# Patient Record
Sex: Female | Born: 1970 | Race: White | Hispanic: No | Marital: Married | Smoking: Never smoker
Health system: Southern US, Community
[De-identification: ages and names within clinical notes are randomized; demographics above are authoritative.]

## PROBLEM LIST (undated history)

## (undated) DIAGNOSIS — R112 Nausea with vomiting, unspecified: Secondary | ICD-10-CM

## (undated) DIAGNOSIS — Z9889 Other specified postprocedural states: Secondary | ICD-10-CM

## (undated) DIAGNOSIS — Z87442 Personal history of urinary calculi: Secondary | ICD-10-CM

## (undated) DIAGNOSIS — A4150 Gram-negative sepsis, unspecified: Secondary | ICD-10-CM

## (undated) DIAGNOSIS — K219 Gastro-esophageal reflux disease without esophagitis: Secondary | ICD-10-CM

---

## 1995-11-06 HISTORY — PX: APPENDECTOMY: SHX54

## 2006-07-12 ENCOUNTER — Emergency Department: Payer: Self-pay | Admitting: Emergency Medicine

## 2008-11-05 HISTORY — PX: ABDOMINAL SURGERY: SHX537

## 2012-09-25 ENCOUNTER — Ambulatory Visit: Payer: Self-pay | Admitting: Family Medicine

## 2013-04-11 ENCOUNTER — Ambulatory Visit: Payer: Self-pay | Admitting: Family Medicine

## 2013-04-11 LAB — COMPREHENSIVE METABOLIC PANEL
Anion Gap: 12 (ref 7–16)
BUN: 13 mg/dL (ref 7–18)
Chloride: 103 mmol/L (ref 98–107)
EGFR (African American): >60
Glucose: 103 mg/dL — ABNORMAL HIGH (ref 65–99)
Osmolality: 276 (ref 275–301)
SGOT(AST): 18 U/L (ref 15–37)
SGPT (ALT): 30 U/L (ref 12–78)
Total Protein: 7.3 g/dL (ref 6.4–8.2)

## 2013-04-11 LAB — CBC WITH DIFFERENTIAL/PLATELET
Eosinophil %: 1.4 %
HCT: 42.6 % (ref 35.0–47.0)
Lymphocyte #: 0.7 10*3/uL — ABNORMAL LOW (ref 1.0–3.6)
Lymphocyte %: 9.9 %
MCH: 29.2 pg (ref 26.0–34.0)
Monocyte %: 4.6 %
Platelet: 299 10*3/uL (ref 150–440)
RBC: 4.91 10*6/uL (ref 3.80–5.20)
WBC: 6.7 10*3/uL (ref 3.6–11.0)

## 2013-04-11 LAB — URINALYSIS, COMPLETE
Bilirubin,UR: NEGATIVE
Leukocyte Esterase: NEGATIVE
Nitrite: NEGATIVE
Ph: 6 (ref 4.5–8.0)
WBC UR: NONE SEEN /HPF (ref 0–5)

## 2013-04-11 LAB — LIPASE, BLOOD: Lipase: 66 U/L — ABNORMAL LOW (ref 73–393)

## 2014-07-08 ENCOUNTER — Ambulatory Visit: Payer: Self-pay | Admitting: Emergency Medicine

## 2015-02-23 ENCOUNTER — Ambulatory Visit
Admit: 2015-02-23 | Disposition: A | Discharge status: Home / Self Care | Payer: Self-pay | Attending: Family Medicine | Admitting: Family Medicine

## 2015-02-23 LAB — RAPID STREP-A WITH REFLX: Micro Text Report: NEGATIVE

## 2015-02-25 LAB — BETA STREP CULTURE(ARMC)

## 2016-01-11 ENCOUNTER — Encounter: Payer: Self-pay | Admitting: Emergency Medicine

## 2016-01-11 ENCOUNTER — Ambulatory Visit
Admission: EM | Admit: 2016-01-11 | Discharge: 2016-01-11 | Disposition: A | Discharge status: Home / Self Care | Payer: BLUE CROSS/BLUE SHIELD | Attending: Family Medicine | Admitting: Family Medicine

## 2016-01-11 DIAGNOSIS — J0100 Acute maxillary sinusitis, unspecified: Secondary | ICD-10-CM | POA: Diagnosis not present

## 2016-01-11 DIAGNOSIS — J40 Bronchitis, not specified as acute or chronic: Secondary | ICD-10-CM

## 2016-01-11 MED ORDER — GUAIFENESIN-CODEINE 100-10 MG/5ML PO SOLN: 10.0000 mL | Freq: Every evening | ORAL | Status: DC | PRN

## 2016-01-11 MED ORDER — AMOXICILLIN-POT CLAVULANATE 875-125 MG PO TABS: 1.0000 | ORAL_TABLET | Freq: Two times a day (BID) | ORAL | Status: DC

## 2016-01-11 NOTE — ED Provider Notes (Signed)
[facility]  ____________________________________________  Time seen: Approximately 2:42 PM  I have reviewed the triage vital signs and the nursing notes.   HISTORY  Chief Complaint Cough   HPI Deborah Mclean is a 45 y.o. female resents with complaints of 1 week of nasal congestion, runny nose, sinus pressure and postnasal drainage. Patient reports in the last several days she has had more of a cough. Patient states that the cough is worse at night. States the cough is primarily a dry cough. Reports frequently blowing her nose getting greenish yellowish drainage out. Patient reports initial symptom onset last Wednesday and Thursday she did have a fever this 2 days but states she has not had a fever since. Reports continues to eat and drink well. Reports has been taking over-the-counter cough and congestion medications which have not really helped.   Denies chest pain, shortness of breath, weakness, dizziness, abdominal pain, dysuria, rash.   LMP: 3 weeks ago, denies chance of pregnancy.   History reviewed. No pertinent past medical history.  There are no active problems to display for this patient.   Past Surgical History  Procedure Laterality Date  . Appendectomy    . Cesarean section    . Abdominal surgery      Current Outpatient Rx  Name  Route  Sig  Dispense  Refill  .           Marland Kitchen             Allergies Review of patient's allergies indicates no known allergies.  History reviewed. No pertinent family history.  Social History Social History  Substance Use Topics  . Smoking status: Never Smoker   . Smokeless tobacco: None  . Alcohol Use: No    Review of Systems Constitutional: as above.  Eyes: No visual changes. ENT: No sore throat. Positive runny nose, nasal congestion and sinus pressure.  Cardiovascular: Denies chest pain. Respiratory: Denies shortness of breath. Gastrointestinal: No abdominal pain.  No nausea, no vomiting.  No diarrhea.  No  constipation. Genitourinary: Negative for dysuria. Musculoskeletal: Negative for back pain. Skin: Negative for rash. Neurological: Negative for headaches, focal weakness or numbness.  10-point ROS otherwise negative.  ____________________________________________   PHYSICAL EXAM:  VITAL SIGNS: ED Triage Vitals  Enc Vitals Group     BP 01/11/16 1400 127/78 mmHg     Pulse Rate 01/11/16 1400 86     Resp 01/11/16 1400 16     Temp 01/11/16 1400 97 F (36.1 C)     Temp Source 01/11/16 1400 Tympanic     SpO2 01/11/16 1400 98 %     Weight 01/11/16 1400 165 lb (74.844 kg)     Height 01/11/16 1400 5\' 6"  (1.676 m)     Head Cir --      Peak Flow --      Pain Score 01/11/16 1403 0     Pain Loc --      Pain Edu? --      Excl. in [place]? --   Constitutional: Alert and oriented. Well appearing and in no acute distress. Eyes: Conjunctivae are normal. PERRL. EOMI. Head: Atraumatic.Mild tenderness to palpation bilateral frontal and maxillary sinuses. No swelling. No erythema.   Ears: no erythema, normal TMs bilaterally.   Nose: nasal congestion with bilateral nasal turbinate erythema and edema. Greenish drainage present.   Mouth/Throat: Mucous membranes are moist.  Oropharynx non-erythematous.No tonsillar swelling or exudate.  Neck: No stridor.  No cervical spine tenderness to palpation. Hematological/Lymphatic/Immunilogical:  No cervical lymphadenopathy. Cardiovascular: Normal rate, regular rhythm. Grossly normal heart sounds.  Good peripheral circulation. Respiratory: Normal respiratory effort.  No retractions. Lungs CTAB. No wheezes, rales or rhonchi. Good air movement. Dry intermittent cough noted in room.  Gastrointestinal: Soft and nontender. No distention. Normal Bowel sounds. No CVA tenderness. Musculoskeletal: No lower or upper extremity tenderness nor edema.  Bilateral pedal pulses equal and easily palpated. No cervical, thoracic or lumbar tenderness to palpation.  Neurologic:  Normal  speech and language. No gross focal neurologic deficits are appreciated. No gait instability. Skin:  Skin is warm, dry and intact. No rash noted. Psychiatric: Mood and affect are normal. Speech and behavior are normal.  ____________________________________________   LABS (all labs ordered are listed, but only abnormal results are displayed)  Labs Reviewed - No data to display   INITIAL IMPRESSION / ASSESSMENT AND PLAN / ED COURSE  Pertinent labs & imaging results that were available during my care of the patient were reviewed by me and considered in my medical decision making (see chart for details).  Well appearing. No acute distress. Presents for complaints of one week of runny nose, nasal congestion, cough. Will treat sinusitis and bronchitis with oral augmentin, prn otc claritin, and prn guaifenesin with codeine. Encourage rest, fluids, otc tylenol or ibuprofen as needed.   Discussed follow up with Primary care physician this week. Discussed follow up and return parameters including no resolution or any worsening concerns. Patient verbalized understanding and agreed to plan.   ____________________________________________   FINAL CLINICAL IMPRESSION(S) / ED DIAGNOSES  Final diagnoses:  Acute maxillary sinusitis, recurrence not specified  Bronchitis      Note: This dictation was prepared with Dragon dictation along with smaller phrase technology. Any transcriptional errors that result from this process are unintentional.    Marylene Land, NP 01/11/16 1455

## 2016-01-11 NOTE — ED Notes (Signed)
Patient c/o cough and chest congestion for 2 days.  Patient reports cold symptoms started a week ago.

## 2016-01-11 NOTE — Discharge Instructions (Signed)
Take medication as prescribed. Rest. Drink plenty of fluids.   Follow up with your primary care physician this week as needed. Return to Urgent care for new or worsening concerns.    Sinusitis, Adult Sinusitis is redness, soreness, and inflammation of the paranasal sinuses. Paranasal sinuses are air pockets within the bones of your face. They are located beneath your eyes, in the middle of your forehead, and above your eyes. In healthy paranasal sinuses, mucus is able to drain out, and air is able to circulate through them by way of your nose. However, when your paranasal sinuses are inflamed, mucus and air can become trapped. This can allow bacteria and other germs to grow and cause infection. Sinusitis can develop quickly and last only a short time (acute) or continue over a long period (chronic). Sinusitis that lasts for more than 12 weeks is considered chronic. CAUSES Causes of sinusitis include:  Allergies.  Structural abnormalities, such as displacement of the cartilage that separates your nostrils (deviated septum), which can decrease the air flow through your nose and sinuses and affect sinus drainage.  Functional abnormalities, such as when the small hairs (cilia) that line your sinuses and help remove mucus do not work properly or are not present. SIGNS AND SYMPTOMS Symptoms of acute and chronic sinusitis are the same. The primary symptoms are pain and pressure around the affected sinuses. Other symptoms include:  Upper toothache.  Earache.  Headache.  Bad breath.  Decreased sense of smell and taste.  A cough, which worsens when you are lying flat.  Fatigue.  Fever.  Thick drainage from your nose, which often is green and may contain pus (purulent).  Swelling and warmth over the affected sinuses. DIAGNOSIS Your health care provider will perform a physical exam. During your exam, your health care provider may perform any of the following to help determine if you have  acute sinusitis or chronic sinusitis:  Look in your nose for signs of abnormal growths in your nostrils (nasal polyps).  Tap over the affected sinus to check for signs of infection.  View the inside of your sinuses using an imaging device that has a light attached (endoscope). If your health care provider suspects that you have chronic sinusitis, one or more of the following tests may be recommended:  Allergy tests.  Nasal culture. A sample of mucus is taken from your nose, sent to a lab, and screened for bacteria.  Nasal cytology. A sample of mucus is taken from your nose and examined by your health care provider to determine if your sinusitis is related to an allergy. TREATMENT Most cases of acute sinusitis are related to a viral infection and will resolve on their own within 10 days. Sometimes, medicines are prescribed to help relieve symptoms of both acute and chronic sinusitis. These may include pain medicines, decongestants, nasal steroid sprays, or saline sprays. However, for sinusitis related to a bacterial infection, your health care provider will prescribe antibiotic medicines. These are medicines that will help kill the bacteria causing the infection. Rarely, sinusitis is caused by a fungal infection. In these cases, your health care provider will prescribe antifungal medicine. For some cases of chronic sinusitis, surgery is needed. Generally, these are cases in which sinusitis recurs more than 3 times per year, despite other treatments. HOME CARE INSTRUCTIONS  Drink plenty of water. Water helps thin the mucus so your sinuses can drain more easily.  Use a humidifier.  Inhale steam 3-4 times a day (for example, sit in  the bathroom with the shower running).  Apply a warm, moist washcloth to your face 3-4 times a day, or as directed by your health care provider.  Use saline nasal sprays to help moisten and clean your sinuses.  Take medicines only as directed by your health care  provider.  If you were prescribed either an antibiotic or antifungal medicine, finish it all even if you start to feel better. SEEK IMMEDIATE MEDICAL CARE IF:  You have increasing pain or severe headaches.  You have nausea, vomiting, or drowsiness.  You have swelling around your face.  You have vision problems.  You have a stiff neck.  You have difficulty breathing.   This information is not intended to replace advice given to you by your health care provider. Make sure you discuss any questions you have with your health care provider.   Document Released: 10/22/2005 Document Revised: 11/12/2014 Document Reviewed: 11/06/2011 Elsevier Interactive Patient Education [employer].

## 2016-07-20 ENCOUNTER — Ambulatory Visit
Admission: EM | Admit: 2016-07-20 | Discharge: 2016-07-20 | Disposition: A | Discharge status: Home / Self Care | Payer: BLUE CROSS/BLUE SHIELD | Attending: Emergency Medicine | Admitting: Emergency Medicine

## 2016-07-20 ENCOUNTER — Encounter: Payer: Self-pay | Admitting: Emergency Medicine

## 2016-07-20 DIAGNOSIS — R05 Cough: Secondary | ICD-10-CM | POA: Diagnosis not present

## 2016-07-20 DIAGNOSIS — J0110 Acute frontal sinusitis, unspecified: Secondary | ICD-10-CM | POA: Diagnosis not present

## 2016-07-20 DIAGNOSIS — R059 Cough, unspecified: Secondary | ICD-10-CM

## 2016-07-20 MED ORDER — FLUTICASONE PROPIONATE 50 MCG/ACT NA SUSP: 2.0000 | Freq: Every day | NASAL | 0 refills | Status: DC

## 2016-07-20 MED ORDER — HYDROCOD POLST-CPM POLST ER 10-8 MG/5ML PO SUER: 5.0000 mL | Freq: Two times a day (BID) | ORAL | 0 refills | Status: DC

## 2016-07-20 MED ORDER — AMOXICILLIN-POT CLAVULANATE 875-125 MG PO TABS: 1.0000 | ORAL_TABLET | Freq: Two times a day (BID) | ORAL | 0 refills | Status: DC

## 2016-07-20 NOTE — ED Provider Notes (Signed)
CSN: [id number]     Arrival date & time 07/20/16  0802 History   First MD Initiated Contact with Patient 07/20/16 [phone]     Chief Complaint  Patient presents with  . Facial Pain   (Consider location/radiation/quality/duration/timing/severity/associated sxs/prior Treatment) HPI  45 year old female who recently returned from [place] after 2 month vacation and has had a week of coughing and 2 days of severe left frontal sinus pain and tooth pain. She states that she is coughing at nighttime a great deal and also during the daytime as well. She tried over-the-counter preparations which have not been helpful Delsym cough syrup has caused her to hallucinate. She's had a history of sinus infections the last one noted was in March 2017 treated here. She has had no fever or chills. All of her pressure seems to be left-sided mostly in the frontal and over the malar region. She states that she is having clear discharge from her nose.   History reviewed. No pertinent past medical history. Past Surgical History:  Procedure Laterality Date  . ABDOMINAL SURGERY    . APPENDECTOMY    . CESAREAN SECTION     Family History  Problem Relation Age of Onset  . Hyperlipidemia Mother   . Hypertension Father   . Hyperlipidemia Father    Social History  Substance Use Topics  . Smoking status: Never Smoker  . Smokeless tobacco: Never Used  . Alcohol use No   OB History    No data available     Review of Systems  Constitutional: Positive for activity change. Negative for appetite change, chills, fatigue and fever.  HENT: Positive for congestion, postnasal drip, rhinorrhea, sinus pressure and sore throat.   Respiratory: Positive for cough. Negative for shortness of breath, wheezing and stridor.   All other systems reviewed and are negative.   Allergies  Review of patient's allergies indicates no known allergies.  Home Medications   Prior to Admission medications   Medication Sig Start Date End Date  Taking? Authorizing Provider  amoxicillin-clavulanate (AUGMENTIN) 875-125 MG tablet Take 1 tablet by mouth every 12 (twelve) hours. 07/20/16   Lorin Picket, PA-C  chlorpheniramine-HYDROcodone (TUSSIONEX PENNKINETIC ER) 10-8 MG/5ML SUER Take 5 mLs by mouth 2 (two) times daily. 07/20/16   Lorin Picket, PA-C  fluticasone (FLONASE) 50 MCG/ACT nasal spray Place 2 sprays into both nostrils daily. 07/20/16   Lorin Picket, PA-C   Meds Ordered and Administered this Visit  Medications - No data to display  BP 107/74 (BP Location: Left Arm)   Pulse 72   Temp 97 F (36.1 C) (Tympanic)   Resp 16   Ht 5\' 6"  (1.676 m)   Wt 160 lb (72.6 kg)   LMP 07/15/2016 (Exact Date)   SpO2 100%   BMI 25.82 kg/m  No data found.   Physical Exam  Constitutional: She is oriented to person, place, and time. She appears well-developed and well-nourished. No distress.  HENT:  Head: Normocephalic and atraumatic.  Right Ear: External ear normal.  Left Ear: External ear normal.  Nose: Nose normal.  Mouth/Throat: Oropharynx is clear and moist. No oropharyngeal exudate.  Patient has left-sided tenderness to percussion over the frontal greater than maxillary sinuses. This is not reproduced on the right side.  Eyes: EOM are normal. Pupils are equal, round, and reactive to light. Right eye exhibits no discharge. Left eye exhibits no discharge.  Neck: Normal range of motion. Neck supple.  Pulmonary/Chest: Effort normal and breath sounds normal. No  respiratory distress. She has no wheezes. She has no rales.  Musculoskeletal: Normal range of motion.  Lymphadenopathy:    She has no cervical adenopathy.  Neurological: She is alert and oriented to person, place, and time.  Skin: Skin is warm and dry. She is not diaphoretic.  Psychiatric: She has a normal mood and affect. Her behavior is normal. Judgment and thought content normal.  Nursing note and vitals reviewed.   Urgent Care Course   Clinical Course     Procedures (including critical care time)  Labs Review Labs Reviewed - No data to display  Imaging Review No results found.   Visual Acuity Review  Right Eye Distance:   Left Eye Distance:   Bilateral Distance:    Right Eye Near:   Left Eye Near:    Bilateral Near:         MDM   1. Acute frontal sinusitis, recurrence not specified   2. Cough    Discharge Medication List as of 07/20/2016  8:32 AM    START taking these medications   Details  amoxicillin-clavulanate (AUGMENTIN) 875-125 MG tablet Take 1 tablet by mouth every 12 (twelve) hours., Starting Fri 07/20/2016, Normal    chlorpheniramine-HYDROcodone (TUSSIONEX PENNKINETIC ER) 10-8 MG/5ML SUER Take 5 mLs by mouth 2 (two) times daily., Starting Fri 07/20/2016, Print    fluticasone (FLONASE) 50 MCG/ACT nasal spray Place 2 sprays into both nostrils daily., Starting Fri 07/20/2016, Normal      Plan: 1. Test/x-ray results and diagnosis reviewed with patient 2. rx as per orders; risks, benefits, potential side effects reviewed with patient 3. Recommend supportive treatment with Cool mist vaporizer at nighttime. She will begin using Flonase daily for the next 3-4 weeks. We'll provide her with Tussionex for her cough at nighttime to allow her to get rest. He did have some hallucinations with the Delsym. Since this is a recurrent problem I will again give her another dose of Augmentin. I have encouraged her to find a primary care physician since her other one  moved. He may return to our clinic in 2 weeks if she continues to cough at which time a x-ray will be obtained of her chest. She should also return if she spikes high fevers or worsens. 4. F/u prn if symptoms worsen or don't improve     Lorin Picket, PA-C 07/20/16 [phone]

## 2016-07-20 NOTE — ED Triage Notes (Signed)
Patient c/o sinus pressure and congestion that started 2 days ago.  Patient c/o cough for over a week.

## 2016-07-25 ENCOUNTER — Ambulatory Visit
Admission: EM | Admit: 2016-07-25 | Discharge: 2016-07-25 | Disposition: A | Discharge status: Home / Self Care | Payer: BLUE CROSS/BLUE SHIELD | Attending: Family Medicine | Admitting: Family Medicine

## 2016-07-25 DIAGNOSIS — J0110 Acute frontal sinusitis, unspecified: Secondary | ICD-10-CM

## 2016-07-25 DIAGNOSIS — T7840XA Allergy, unspecified, initial encounter: Secondary | ICD-10-CM | POA: Diagnosis not present

## 2016-07-25 DIAGNOSIS — J40 Bronchitis, not specified as acute or chronic: Secondary | ICD-10-CM

## 2016-07-25 MED ORDER — DIPHENHYDRAMINE HCL 50 MG PO CAPS
50.0000 mg | ORAL_CAPSULE | Freq: Once | ORAL | Status: AC
  Administered 2016-07-25: 50 mg via ORAL

## 2016-07-25 MED ORDER — PREDNISONE 10 MG PO TABS: ORAL_TABLET | ORAL | 0 refills | Status: DC

## 2016-07-25 MED ORDER — DOXYCYCLINE HYCLATE 100 MG PO CAPS: 100.0000 mg | ORAL_CAPSULE | Freq: Two times a day (BID) | ORAL | 0 refills | Status: DC

## 2016-07-25 NOTE — ED Triage Notes (Signed)
Patient reports that yesterday she had tongue swelling and today has itchiness of her lips. Patient states that she was here on Friday and was given and antibiotic and codeine. Patient states that she is still having a cough that worsened yesterday as well. Patient states that she thinks she may have had an allergic reaction to something. Patient states that yesterday she took benadryl and improved.

## 2016-07-25 NOTE — Discharge Instructions (Signed)
Take medication as prescribed. Rest. Drink plenty of fluids. Take benadryl as discussed.   Follow up with your primary care physician this week as needed. Return to Urgent care for new or worsening concerns.

## 2016-07-25 NOTE — ED Provider Notes (Signed)
[facility] ____________________________________________  Time seen: Approximately 2:32 PM  I have reviewed the triage vital signs and the nursing notes.   HISTORY  Chief Complaint Allergic Reaction and Cough   HPI Deborah Mclean is a 45 y.o. female patient presenting for continued complaints of sinus congestion, sinus drainage and sinus pressure. Patient also reports continued cough. Patient reports that she was seen in urgent care 3 days ago and was started on oral Augmentin and tussionex for frontal sinusitis. Patient reports that yesterday she began having tongue swelling after taking her medications yesterday afternoon. Patient reports that her tongue felt very swollen and puffy, reports that she then took a Benadryl at lunchtime which resolved the swelling. Patient reports she has not yet taken the medications again. Reports no more tongue swelling since. Patient reports that swelling has now resolved and she does not have any other swelling, but reports she does have a slight itching sensation to her lips that is better than yesterday.   Patient reports that she has taken amoxicillin antibiotics and tussionex in the past without any reaction or issues. Patient declines any other changes in foods, medicines, lotions, detergents. Denies any other triggers. Patient reports that overall her sinus congestion pain has improved but continues. Patient reports that her cough is worse at night with associated postnasal drainage. Patient denies fevers. Reports continues to eat and drink well.  Denies any chest pain, shortness of breath, abdominal pain, dysuria, neck pain, back pain. Denies any current lip swelling, tongue swelling, throat swelling, throat discomfort, wheezing, shortness of breath, facial swelling, rash or other complaints. Denies any history of allergic reactions in the past.  Patient's last menstrual period was 07/15/2016 (exact date). Denies chance  present.   History reviewed. No pertinent past medical history.  There are no active problems to display for this patient.   Past Surgical History:  Procedure Laterality Date  . ABDOMINAL SURGERY    . APPENDECTOMY    . CESAREAN SECTION      Current Outpatient Rx  . Order #: [id number] Class: Normal  . Order #: [id number] Class: Print  . Order #: [id number] Class: Normal  . Order #: [id number] Class: Normal  . Order #: [id number] Class: Normal    No current facility-administered medications for this encounter.   Current Outpatient Prescriptions:  .  amoxicillin-clavulanate (AUGMENTIN) 875-125 MG tablet, Take 1 tablet by mouth every 12 (twelve) hours., Disp: 20 tablet, Rfl: 0 .  chlorpheniramine-HYDROcodone (TUSSIONEX PENNKINETIC ER) 10-8 MG/5ML SUER, Take 5 mLs by mouth 2 (two) times daily., Disp: 115 mL, Rfl: 0 .  fluticasone (FLONASE) 50 MCG/ACT nasal spray, Place 2 sprays into both nostrils daily., Disp: 16 g, Rfl: 0 .  doxycycline (VIBRAMYCIN) 100 MG capsule, Take 1 capsule (100 mg total) by mouth 2 (two) times daily., Disp: 20 capsule, Rfl: 0 .  predniSONE (DELTASONE) 10 MG tablet, Start 60 mg po day one, then 50 mg po day two, taper by 10 mg daily until complete., Disp: 21 tablet, Rfl: 0  Allergies Review of patient's allergies indicates no known allergies.  Family History  Problem Relation Age of Onset  . Hyperlipidemia Mother   . Hypertension Father   . Hyperlipidemia Father     Social History Social History  Substance Use Topics  . Smoking status: Never Smoker  . Smokeless tobacco: Never Used  . Alcohol use No    Review of Systems Constitutional: No fever/chills Eyes: No visual changes. ENT: No sore throat. Cardiovascular: Denies chest pain. Respiratory: Denies shortness  of breath. Gastrointestinal: No abdominal pain.  No nausea, no vomiting.  No diarrhea.  No constipation. Genitourinary: Negative for dysuria. Musculoskeletal: Negative for back pain. Skin:  Negative for rash. Neurological: Negative for headaches, focal weakness or numbness.  10-point ROS otherwise negative.  ____________________________________________   PHYSICAL EXAM:  VITAL SIGNS: ED Triage Vitals  Enc Vitals Group     BP 07/25/16 1423 111/78     Pulse Rate 07/25/16 1423 83     Resp 07/25/16 1423 16     Temp 07/25/16 1423 98 F (36.7 C)     Temp Source 07/25/16 1423 Oral     SpO2 07/25/16 1423 100 %     Weight 07/25/16 1425 160 lb (72.6 kg)     Height 07/25/16 1425 5\' 6"  (1.676 m)     Head Circumference --      Peak Flow --      Pain Score 07/25/16 1426 0     Pain Loc --      Pain Edu? --      Excl. in [place]? --     Constitutional: Alert and oriented. Well appearing and in no acute distress. Eyes: Conjunctivae are normal. PERRL. EOMI. ENT      Head: Normocephalic and atraumatic.      Ears: no erythema, normal TMs. Nontender.       Nose: No congestion/rhinnorhea.      Mouth/Throat: Mucous membranes are moist.Oropharynx non-erythematous. No tonsillar swelling or exudate. No lip, tongue or oropharyngeal edema noted.  Neck: No stridor. Supple without meningismus.  Hematological/Lymphatic/Immunilogical: No cervical lymphadenopathy. Cardiovascular: Normal rate, regular rhythm. Grossly normal heart sounds.  Good peripheral circulation. Respiratory: Normal respiratory effort without tachypnea nor retractions. Breath sounds are clear and equal bilaterally. No wheezes/rales/rhonchi.. Gastrointestinal: Soft and nontender.  Musculoskeletal:  Nontender with normal range of motion in all extremities. No midline cervical, thoracic or lumbar tenderness to palpation.  Neurologic:  Normal speech and language. No gross focal neurologic deficits are appreciated. Speech is normal. No gait instability.  Skin:  Skin is warm, dry and intact. No rash noted. Psychiatric: Mood and affect are normal. Speech and behavior are normal. Patient exhibits appropriate insight and judgment    ___________________________________________   LABS (all labs ordered are listed, but only abnormal results are displayed)  Labs Reviewed - No data to display ____________________________________________   PROCEDURES Procedures     INITIAL IMPRESSION / ASSESSMENT AND PLAN / ED COURSE  Pertinent labs & imaging results that were available during my care of the patient were reviewed by me and considered in my medical decision making (see chart for details).  Very well-appearing patient. No acute distress. Patient recently seen and evaluated for frontal sinusitis and started on Tussionex and Augmentin. Patient reports tongue swelling yesterday resolve with Benadryl. States slight itching sensation to lips at this time which has improved since yesterday. Denies any shortness of breath, difficulty swallowing, difficulty eating or talking or other swelling or rash. Patient exam reassuring. Patient does still have nasal congestion, sinus tenderness and cough. Will stop Augmentin and Tussionex. Will start patient on oral prednisone taper, oral doxycycline and directed to continue home Benadryl. Encourage rest, fluids and closely monitoring of symptoms.Discussed indication, risks and benefits of medications with patient. Discussed strict follow-up and return parameters.  Discussed follow up with Primary care physician this week. Discussed follow up and return parameters including no resolution or any worsening concerns. Patient verbalized understanding and agreed to plan.   ____________________________________________   FINAL CLINICAL  IMPRESSION(S) / ED DIAGNOSES  Final diagnoses:  Acute frontal sinusitis, recurrence not specified  Allergic reaction, initial encounter  Bronchitis     Discharge Medication List as of 07/25/2016  3:02 PM    START taking these medications   Details  doxycycline (VIBRAMYCIN) 100 MG capsule Take 1 capsule (100 mg total) by mouth 2 (two) times daily., Starting  Wed 07/25/2016, Normal    predniSONE (DELTASONE) 10 MG tablet Start 60 mg po day one, then 50 mg po day two, taper by 10 mg daily until complete., Normal        Note: This dictation was prepared with Dragon dictation along with smaller phrase technology. Any transcriptional errors that result from this process are unintentional.    Clinical Course      Marylene Land, NP 07/25/16 1930

## 2016-09-11 ENCOUNTER — Other Ambulatory Visit: Payer: Self-pay | Admitting: Obstetrics and Gynecology

## 2016-09-12 ENCOUNTER — Other Ambulatory Visit: Payer: Self-pay | Admitting: Obstetrics and Gynecology

## 2016-09-12 DIAGNOSIS — N6320 Unspecified lump in the left breast, unspecified quadrant: Secondary | ICD-10-CM

## 2016-10-02 ENCOUNTER — Ambulatory Visit
Admission: RE | Admit: 2016-10-02 | Discharge: 2016-10-02 | Disposition: A | Discharge status: Home / Self Care | Payer: BLUE CROSS/BLUE SHIELD | Source: Ambulatory Visit | Attending: Obstetrics and Gynecology | Admitting: Obstetrics and Gynecology

## 2016-10-02 DIAGNOSIS — N6320 Unspecified lump in the left breast, unspecified quadrant: Secondary | ICD-10-CM

## 2016-10-02 DIAGNOSIS — R921 Mammographic calcification found on diagnostic imaging of breast: Secondary | ICD-10-CM | POA: Diagnosis not present

## 2016-10-02 DIAGNOSIS — N6310 Unspecified lump in the right breast, unspecified quadrant: Secondary | ICD-10-CM | POA: Insufficient documentation

## 2016-10-02 DIAGNOSIS — N6002 Solitary cyst of left breast: Secondary | ICD-10-CM | POA: Diagnosis not present

## 2016-10-05 ENCOUNTER — Other Ambulatory Visit: Payer: Self-pay | Admitting: Obstetrics and Gynecology

## 2016-10-05 DIAGNOSIS — R928 Other abnormal and inconclusive findings on diagnostic imaging of breast: Secondary | ICD-10-CM

## 2016-10-05 DIAGNOSIS — N6310 Unspecified lump in the right breast, unspecified quadrant: Secondary | ICD-10-CM

## 2016-10-16 ENCOUNTER — Ambulatory Visit
Admission: RE | Admit: 2016-10-16 | Discharge: 2016-10-16 | Disposition: A | Discharge status: Home / Self Care | Payer: BLUE CROSS/BLUE SHIELD | Source: Ambulatory Visit | Attending: Obstetrics and Gynecology | Admitting: Obstetrics and Gynecology

## 2016-10-16 DIAGNOSIS — R928 Other abnormal and inconclusive findings on diagnostic imaging of breast: Secondary | ICD-10-CM

## 2016-10-16 DIAGNOSIS — N6310 Unspecified lump in the right breast, unspecified quadrant: Secondary | ICD-10-CM

## 2016-10-16 DIAGNOSIS — D241 Benign neoplasm of right breast: Secondary | ICD-10-CM | POA: Insufficient documentation

## 2016-10-16 HISTORY — PX: BREAST BIOPSY: SHX20

## 2016-10-17 LAB — SURGICAL PATHOLOGY

## 2016-10-19 ENCOUNTER — Telehealth: Payer: Self-pay | Admitting: General Surgery

## 2016-10-19 ENCOUNTER — Telehealth: Payer: Self-pay | Admitting: *Deleted

## 2016-10-19 NOTE — Telephone Encounter (Signed)
Email received from [facility] from radiology.  Patient needs surgical consult for possible excision of a benign breast lesion.  Patient called per protocol and scheduled to see Dr. Jamal Collin per her request, on 10/26/16 @ 2:00.  She is to arrive 30 minutes early to complete paperwork.  She is to take a photo ID and all her meds with her to the appointment.

## 2016-10-19 NOTE — Telephone Encounter (Signed)
10-19-16 @ 10:16 AM L/M FOR PT TO RETURN CALL TO SEE WHAT INSURANCE SHE HAS FOR DOS 10-25-16 APPT.DOES SHE NEED A REF./MTH

## 2016-10-25 ENCOUNTER — Encounter: Payer: Self-pay | Admitting: General Surgery

## 2016-10-25 ENCOUNTER — Ambulatory Visit (INDEPENDENT_AMBULATORY_CARE_PROVIDER_SITE_OTHER): Payer: BLUE CROSS/BLUE SHIELD | Admitting: General Surgery

## 2016-10-25 VITALS — BP 136/74 | HR 72 | Resp 14 | Ht 66.0 in | Wt 168.0 lb

## 2016-10-25 DIAGNOSIS — N6310 Unspecified lump in the right breast, unspecified quadrant: Secondary | ICD-10-CM | POA: Diagnosis not present

## 2016-10-25 NOTE — Progress Notes (Signed)
Patient ID: Deborah Mclean, female   DOB: [birth year], 45 y.o.   MRN: [id number]  Chief Complaint  Patient presents with  . Breast Problem    HPI Deborah Mclean is a 45 y.o. female.  who presents for a breast evaluation. The most recent baseline mammogram and right breast biopsy was done on 10-16-16.  Patient does perform regular self breast checks and this was her first mammogram.   No family history of breast cancer. No breast injury. I have reviewed the history of present illness with the patient.  HPI  No past medical history on file.  Past Surgical History:  Procedure Laterality Date  . ABDOMINAL SURGERY  2010  . APPENDECTOMY  1997  . BREAST BIOPSY Right 10/16/2016   FIBROEPITHELIAL LESION WITH PROMINENT INTRACANALICULAR GROWTH PATTERN  . CESAREAN SECTION  2009, 2012    Family History  Problem Relation Age of Onset  . Hyperlipidemia Mother   . Hypertension Father   . Hyperlipidemia Father   . Breast cancer Neg Hx     Social History Social History  Substance Use Topics  . Smoking status: Never Smoker  . Smokeless tobacco: Never Used  . Alcohol use No    Allergies  Allergen Reactions  . Amoxicillin-Pot Clavulanate Anaphylaxis    Current Outpatient Prescriptions  Medication Sig Dispense Refill  . Multiple Vitamin (MULTIVITAMIN) capsule Take 1 capsule by mouth daily.     No current facility-administered medications for this visit.     Review of Systems Review of Systems  Constitutional: Negative.   Respiratory: Negative.   Cardiovascular: Negative.     Blood pressure 136/74, pulse 72, resp. rate 14, height 5\' 6"  (1.676 m), weight 168 lb (76.2 kg), last menstrual period 10/20/2016.  Physical Exam Physical Exam  Constitutional: She is oriented to person, place, and time. She appears well-developed and well-nourished.  Eyes: Conjunctivae are normal. No scleral icterus.  Neck: Neck supple.  Cardiovascular: Normal rate, regular rhythm and normal  heart sounds.   Pulmonary/Chest: Effort normal and breath sounds normal. Right breast exhibits no inverted nipple, no mass, no nipple discharge, no skin change and no tenderness. Left breast exhibits no inverted nipple, no mass ( left breast cyst), no nipple discharge, no skin change and no tenderness.  Right breast with mild ecchymosis superior aspect  Abdominal: Soft. Normal appearance and bowel sounds are normal. There is no hepatomegaly. There is no tenderness. No hernia.  Lymphadenopathy:    She has no cervical adenopathy.  Neurological: She is alert and oriented to person, place, and time.  Skin: Skin is warm and dry.  Psychiatric: Her behavior is normal.    Data Reviewed Mammogram, [place] and and pathology  Assessment    Multiple cysts in left breast. 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     Plan   Pt advised fully on path report. The mass in right breast needs to be excised completely.  Procedure explained and pt is agreeable. Patient to be scheduled excision right breast mass at [facility].  This patient's surgery has been scheduled for 11-01-16.       This information has been scribed by Karie Fetch RN, BSN,BC.   Deborah Mclean 10/26/2016, 9:00 AM

## 2016-10-25 NOTE — Patient Instructions (Addendum)
The patient is aware to call back for any questions or concerns.    Patient to return for excision right breast mass at [facility]

## 2016-10-26 ENCOUNTER — Ambulatory Visit: Payer: Self-pay | Admitting: Surgery

## 2016-10-30 ENCOUNTER — Encounter
Admission: RE | Admit: 2016-10-30 | Discharge: 2016-10-30 | Disposition: A | Discharge status: Home / Self Care | Payer: BLUE CROSS/BLUE SHIELD | Source: Ambulatory Visit | Attending: General Surgery | Admitting: General Surgery

## 2016-10-30 NOTE — Patient Instructions (Signed)
Your procedure is scheduled on: Thursday 11/01/16 Report to [place]. 2ND FLOOR MEDICAL MALL ENTRANCE. To find out your arrival time please call [phone] between 1PM - 3PM on Wednesday 10/31/16.  Remember: Instructions that are not followed completely may result in serious medical risk, up to and including death, or upon the discretion of your surgeon and anesthesiologist your surgery may need to be rescheduled.    __X__ 1. Do not eat food or drink liquids after midnight. No gum chewing or hard candies.     __X__ 2. No Alcohol for 24 hours before or after surgery.   ____ 3. Bring all medications with you on the day of surgery if instructed.    __X__ 4. Notify your doctor if there is any change in your medical condition     (cold, fever, infections).             __x___5. No smoking within 24 hours of your surgery.     Do not wear jewelry, make-up, hairpins, clips or nail polish.  Do not wear lotions, powders, or perfumes.   Do not shave 48 hours prior to surgery. Men may shave face and neck.  Do not bring valuables to the hospital.    [facility] is not responsible for any belongings or valuables.               Contacts, dentures or bridgework may not be worn into surgery.  Leave your suitcase in the car. After surgery it may be brought to your room.  For patients admitted to the hospital, discharge time is determined by your                treatment team.   Patients discharged the day of surgery will not be allowed to drive home.   Please read over the following fact sheets that you were given:   MRSA Information   __x__ Take these medicines the morning of surgery with A SIP OF WATER:    1. nexium  2.   3.   4.  5.  6.  ____ Fleet Enema (as directed)   ____ Use CHG Soap as directed  ____ Use inhalers on the day of surgery  ____ Stop metformin 2 days prior to surgery    ____ Take 1/2 of usual insulin dose the night before surgery and none on the morning of  surgery.   ____ Stop Coumadin/Plavix/aspirin on   __X__ Stop Anti-inflammatories such as Advil, Aleve, Ibuprofen, Motrin, Naproxen, Naprosyn, Goodies,powder, or aspirin products.  OK to take Tylenol.   ____ Stop supplements until after surgery.    ____ Bring C-Pap to the hospital.

## 2016-11-01 ENCOUNTER — Ambulatory Visit: Payer: BLUE CROSS/BLUE SHIELD | Admitting: Anesthesiology

## 2016-11-01 ENCOUNTER — Ambulatory Visit
Admission: RE | Admit: 2016-11-01 | Discharge: 2016-11-01 | Disposition: A | Discharge status: Home / Self Care | Payer: BLUE CROSS/BLUE SHIELD | Source: Ambulatory Visit | Attending: General Surgery | Admitting: General Surgery

## 2016-11-01 ENCOUNTER — Encounter: Payer: Self-pay | Admitting: *Deleted

## 2016-11-01 ENCOUNTER — Encounter
Admission: RE | Disposition: A | Discharge status: Home / Self Care | Payer: Self-pay | Source: Ambulatory Visit | Attending: General Surgery

## 2016-11-01 DIAGNOSIS — N6310 Unspecified lump in the right breast, unspecified quadrant: Secondary | ICD-10-CM

## 2016-11-01 DIAGNOSIS — Z880 Allergy status to penicillin: Secondary | ICD-10-CM | POA: Diagnosis not present

## 2016-11-01 DIAGNOSIS — D241 Benign neoplasm of right breast: Secondary | ICD-10-CM | POA: Diagnosis not present

## 2016-11-01 DIAGNOSIS — D493 Neoplasm of unspecified behavior of breast: Secondary | ICD-10-CM | POA: Diagnosis present

## 2016-11-01 DIAGNOSIS — D4861 Neoplasm of uncertain behavior of right breast: Secondary | ICD-10-CM | POA: Diagnosis not present

## 2016-11-01 HISTORY — PX: BREAST LUMPECTOMY: SHX2

## 2016-11-01 HISTORY — DX: Gastro-esophageal reflux disease without esophagitis: K21.9

## 2016-11-01 HISTORY — DX: Other specified postprocedural states: Z98.890

## 2016-11-01 HISTORY — DX: Personal history of urinary calculi: Z87.442

## 2016-11-01 HISTORY — DX: Nausea with vomiting, unspecified: R11.2

## 2016-11-01 LAB — POCT PREGNANCY, URINE: PREG TEST UR: NEGATIVE

## 2016-11-01 SURGERY — BREAST LUMPECTOMY
Anesthesia: General | Laterality: Right | Wound class: Contaminated

## 2016-11-01 MED ORDER — ONDANSETRON HCL 4 MG/2ML IJ SOLN
INTRAMUSCULAR | Status: DC | PRN
Start: 2016-11-01 — End: 2016-11-01
  Administered 2016-11-01: 4 mg via INTRAVENOUS

## 2016-11-01 MED ORDER — LACTATED RINGERS IV SOLN
INTRAVENOUS | Status: DC
  Administered 2016-11-01: 07:00:00 via INTRAVENOUS

## 2016-11-01 MED ORDER — SCOPOLAMINE 1 MG/3DAYS TD PT72
MEDICATED_PATCH | TRANSDERMAL | Status: AC
  Filled 2016-11-01: qty 1

## 2016-11-01 MED ORDER — LIDOCAINE HCL (CARDIAC) 20 MG/ML IV SOLN
INTRAVENOUS | Status: DC | PRN
  Administered 2016-11-01: 80 mg via INTRAVENOUS

## 2016-11-01 MED ORDER — FENTANYL CITRATE (PF) 100 MCG/2ML IJ SOLN
INTRAMUSCULAR | Status: DC | PRN
  Administered 2016-11-01: 25 ug via INTRAVENOUS
  Administered 2016-11-01: 50 ug via INTRAVENOUS

## 2016-11-01 MED ORDER — PROMETHAZINE HCL 25 MG/ML IJ SOLN
6.2500 mg | Freq: Once | INTRAMUSCULAR | Status: AC
  Administered 2016-11-01: 6.25 mg via INTRAVENOUS

## 2016-11-01 MED ORDER — ONDANSETRON HCL 4 MG/2ML IJ SOLN
4.0000 mg | Freq: Once | INTRAMUSCULAR | Status: AC | PRN
Start: 2016-11-01 — End: 2016-11-01
  Administered 2016-11-01: 4 mg via INTRAVENOUS

## 2016-11-01 MED ORDER — MIDAZOLAM HCL 2 MG/2ML IJ SOLN
INTRAMUSCULAR | Status: AC
  Filled 2016-11-01: qty 2

## 2016-11-01 MED ORDER — CHLORHEXIDINE GLUCONATE CLOTH 2 % EX PADS: 6.0000 | MEDICATED_PAD | Freq: Once | CUTANEOUS | Status: DC

## 2016-11-01 MED ORDER — ONDANSETRON HCL 4 MG/2ML IJ SOLN
INTRAMUSCULAR | Status: AC
  Filled 2016-11-01: qty 2

## 2016-11-01 MED ORDER — SODIUM CHLORIDE 0.9 % IJ SOLN
INTRAMUSCULAR | Status: AC
  Filled 2016-11-01: qty 10

## 2016-11-01 MED ORDER — EPHEDRINE 5 MG/ML INJ
INTRAVENOUS | Status: AC
  Filled 2016-11-01: qty 10

## 2016-11-01 MED ORDER — FAMOTIDINE 20 MG PO TABS
ORAL_TABLET | ORAL | Status: AC
  Filled 2016-11-01: qty 1

## 2016-11-01 MED ORDER — KETOROLAC TROMETHAMINE 30 MG/ML IJ SOLN
INTRAMUSCULAR | Status: DC | PRN
  Administered 2016-11-01: 30 mg via INTRAVENOUS

## 2016-11-01 MED ORDER — PROPOFOL 10 MG/ML IV BOLUS
INTRAVENOUS | Status: AC
  Filled 2016-11-01: qty 20

## 2016-11-01 MED ORDER — FENTANYL CITRATE (PF) 100 MCG/2ML IJ SOLN
INTRAMUSCULAR | Status: AC
  Filled 2016-11-01: qty 2

## 2016-11-01 MED ORDER — MIDAZOLAM HCL 2 MG/2ML IJ SOLN
INTRAMUSCULAR | Status: DC | PRN
  Administered 2016-11-01: 2 mg via INTRAVENOUS

## 2016-11-01 MED ORDER — KETOROLAC TROMETHAMINE 30 MG/ML IJ SOLN
INTRAMUSCULAR | Status: AC
  Filled 2016-11-01: qty 1

## 2016-11-01 MED ORDER — FAMOTIDINE 20 MG PO TABS: 20.0000 mg | ORAL_TABLET | Freq: Once | ORAL | Status: DC

## 2016-11-01 MED ORDER — PROPOFOL 10 MG/ML IV BOLUS
INTRAVENOUS | Status: DC | PRN
  Administered 2016-11-01: 180 mg via INTRAVENOUS

## 2016-11-01 MED ORDER — PROMETHAZINE HCL 25 MG/ML IJ SOLN
INTRAMUSCULAR | Status: AC
  Filled 2016-11-01: qty 1

## 2016-11-01 MED ORDER — FENTANYL CITRATE (PF) 100 MCG/2ML IJ SOLN: 25.0000 ug | INTRAMUSCULAR | Status: DC | PRN

## 2016-11-01 MED ORDER — DEXAMETHASONE SODIUM PHOSPHATE 10 MG/ML IJ SOLN
INTRAMUSCULAR | Status: DC | PRN
  Administered 2016-11-01: 10 mg via INTRAVENOUS

## 2016-11-01 MED ORDER — TRAMADOL HCL 50 MG PO TABS: 50.0000 mg | ORAL_TABLET | Freq: Four times a day (QID) | ORAL | 0 refills | Status: DC | PRN

## 2016-11-01 MED ORDER — BUPIVACAINE HCL (PF) 0.5 % IJ SOLN
INTRAMUSCULAR | Status: AC
  Filled 2016-11-01: qty 30

## 2016-11-01 MED ORDER — DEXAMETHASONE SODIUM PHOSPHATE 10 MG/ML IJ SOLN
INTRAMUSCULAR | Status: AC
  Filled 2016-11-01: qty 1

## 2016-11-01 MED ORDER — BUPIVACAINE HCL (PF) 0.5 % IJ SOLN
INTRAMUSCULAR | Status: DC | PRN
  Administered 2016-11-01: 20 mL

## 2016-11-01 MED ORDER — SCOPOLAMINE 1 MG/3DAYS TD PT72
1.0000 | MEDICATED_PATCH | Freq: Once | TRANSDERMAL | Status: DC
  Administered 2016-11-01: 1.5 mg via TRANSDERMAL

## 2016-11-01 MED ORDER — SUCCINYLCHOLINE CHLORIDE 200 MG/10ML IV SOSY
PREFILLED_SYRINGE | INTRAVENOUS | Status: AC
  Filled 2016-11-01: qty 20

## 2016-11-01 MED ORDER — EPHEDRINE SULFATE 50 MG/ML IJ SOLN
INTRAMUSCULAR | Status: DC | PRN
  Administered 2016-11-01 (×2): 5 mg via INTRAVENOUS

## 2016-11-01 MED ORDER — LIDOCAINE 2% (20 MG/ML) 5 ML SYRINGE
INTRAMUSCULAR | Status: AC
  Filled 2016-11-01: qty 5

## 2016-11-01 SURGICAL SUPPLY — 32 items
BLADE SURG 15 STRL SS SAFETY (BLADE) ×3 IMPLANT
BULB RESERV EVAC DRAIN JP 100C (MISCELLANEOUS) IMPLANT
CANISTER SUCT 1200ML W/VALVE (MISCELLANEOUS) ×3 IMPLANT
CHLORAPREP W/TINT 26ML (MISCELLANEOUS) ×3 IMPLANT
CNTNR SPEC 2.5X3XGRAD LEK (MISCELLANEOUS) ×1
CONT SPEC 4OZ STER OR WHT (MISCELLANEOUS) ×2
CONTAINER SPEC 2.5X3XGRAD LEK (MISCELLANEOUS) ×1 IMPLANT
COVER PROBE FLX POLY STRL (MISCELLANEOUS) ×3 IMPLANT
DERMABOND ADVANCED (GAUZE/BANDAGES/DRESSINGS) ×2
DERMABOND ADVANCED .7 DNX12 (GAUZE/BANDAGES/DRESSINGS) ×1 IMPLANT
DEVICE LOCALIZATION ULTRAWIRE (WIRE) ×1 IMPLANT
DRAIN CHANNEL JP 15F RND 16 (MISCELLANEOUS) IMPLANT
DRAPE LAPAROTOMY TRNSV 106X77 (MISCELLANEOUS) ×3 IMPLANT
ELECT REM PT RETURN 9FT ADLT (ELECTROSURGICAL) ×3
ELECTRODE REM PT RTRN 9FT ADLT (ELECTROSURGICAL) ×1 IMPLANT
GLOVE BIO SURGEON STRL SZ7 (GLOVE) ×12 IMPLANT
GOWN STRL REUS W/ TWL LRG LVL3 (GOWN DISPOSABLE) ×2 IMPLANT
GOWN STRL REUS W/TWL LRG LVL3 (GOWN DISPOSABLE) ×4
HARMONIC SCALPEL FOCUS (MISCELLANEOUS) IMPLANT
KIT RM TURNOVER STRD PROC AR (KITS) ×3 IMPLANT
LABEL OR SOLS (LABEL) IMPLANT
MARGIN MAP 10MM (MISCELLANEOUS) ×3 IMPLANT
NEEDLE HYPO 25X1 1.5 SAFETY (NEEDLE) ×3 IMPLANT
PACK BASIN MINOR ARMC (MISCELLANEOUS) ×3 IMPLANT
SUT ETH BLK MONO 3 0 FS 1 12/B (SUTURE) ×3 IMPLANT
SUT MNCRL AB 3-0 PS2 27 (SUTURE) ×3 IMPLANT
SUT VIC AB 2-0 BRD 54 (SUTURE) ×3 IMPLANT
SUT VIC AB 2-0 CT2 27 (SUTURE) ×3 IMPLANT
SYR CONTROL 10ML (SYRINGE) ×3 IMPLANT
TAPE TRANSPORE STRL 2 31045 (GAUZE/BANDAGES/DRESSINGS) ×3 IMPLANT
ULTRAWIRE LOCALIZATION DEVICE (WIRE) ×3
WATER STERILE IRR 1000ML POUR (IV SOLUTION) ×3 IMPLANT

## 2016-11-01 NOTE — OR Nursing (Signed)
Awake and alert, denies nausea, Dr. Jamal Collin in to see pt.

## 2016-11-01 NOTE — Interval H&P Note (Signed)
History and Physical Interval Note:  11/01/2016 7:14 AM  Deborah Mclean  has presented today for surgery, with the diagnosis of phylloids tumor right breast  The various methods of treatment have been discussed with the patient and family. After consideration of risks, benefits and other options for treatment, the patient has consented to  Procedure(s): BREAST LUMPECTOMY (Right) as a surgical intervention .  The patient's history has been reviewed, patient examined, no change in status, stable for surgery.  I have reviewed the patient's chart and labs.  Questions were answered to the patient's satisfaction.     Keisi Eckford G

## 2016-11-01 NOTE — Op Note (Signed)
Preop diagnosis: Fibroepithelial tumor right breast  Post op diagnosis: Same  Operation: Right breast lumpectomy with ultrasound guidance a wire localization  Surgeon: Mckinley Jewel  Assistant:     Anesthesia: Gen.  Complications: None  EBL: Less than 5 mL  Drains: None  Description: This patient was found to have a solid mass in the superior right breast and underwent core biopsy. Biopsy revealed a fibroepithelial lesion and could not rule out a possible phylloides. Complete excision was therefore recommended. Patient was brought the operating room placed supine on the operating table and put to sleep with an LMA. Right breast was prepped and draped as sterile field and timeout performed. Ultrasound probe was brought up and the mass at the 12:00 location 9 cm from the nipple on the right breast was identified. With small stab incision laterally the mass in question was localized with the use of a Bard ultrwire. Curvilinear skin incision was then made overlying the side of the mass extending from the wire entrance side medially. Half percent Marcaine was then instilled around the area for postop analgesia with a total of 20 mL used. Skin incision was deepened through to the glandular tissue and the skin and subcutaneous tissue was elevated on both sides. Using the wire as a guide a firm mass was felt and complete excision was then accomplished with the use of cautery. The excised tissue was tagged for margins and sent to pathology. After ensuring hemostasis the gland tissue was reapproximated with 2-0 Vicryl. Subcutaneous tissue also closed with 2-0 Vicryl. Skin closed with subcuticular 3-0 Monocryl and covered with Dermabond. Procedure was well-tolerated with no immediate problems encountered and patient subsequently returned recovery room stable condition

## 2016-11-01 NOTE — H&P (View-Only) (Signed)
Patient ID: Deborah Mclean, female   DOB: 09-23-1971, 45 y.o.   MRN: [id number]  Chief Complaint  Patient presents with  . Breast Problem    HPI Deborah Mclean is a 45 y.o. female.  who presents for a breast evaluation. The most recent baseline mammogram and right breast biopsy was done on 10-16-16.  Patient does perform regular self breast checks and this was her first mammogram.   No family history of breast cancer. No breast injury. I have reviewed the history of present illness with the patient.  HPI  No past medical history on file.  Past Surgical History:  Procedure Laterality Date  . ABDOMINAL SURGERY  2010  . APPENDECTOMY  1997  . BREAST BIOPSY Right 10/16/2016   FIBROEPITHELIAL LESION WITH PROMINENT INTRACANALICULAR GROWTH PATTERN  . CESAREAN SECTION  2009, 2012    Family History  Problem Relation Age of Onset  . Hyperlipidemia Mother   . Hypertension Father   . Hyperlipidemia Father   . Breast cancer Neg Hx     Social History Social History  Substance Use Topics  . Smoking status: Never Smoker  . Smokeless tobacco: Never Used  . Alcohol use No    Allergies  Allergen Reactions  . Amoxicillin-Pot Clavulanate Anaphylaxis    Current Outpatient Prescriptions  Medication Sig Dispense Refill  . Multiple Vitamin (MULTIVITAMIN) capsule Take 1 capsule by mouth daily.     No current facility-administered medications for this visit.     Review of Systems Review of Systems  Constitutional: Negative.   Respiratory: Negative.   Cardiovascular: Negative.     Blood pressure 136/74, pulse 72, resp. rate 14, height 5\' 6"  (1.676 m), weight 168 lb (76.2 kg), last menstrual period 10/20/2016.  Physical Exam Physical Exam  Constitutional: She is oriented to person, place, and time. She appears well-developed and well-nourished.  Eyes: Conjunctivae are normal. No scleral icterus.  Neck: Neck supple.  Cardiovascular: Normal rate, regular rhythm and normal  heart sounds.   Pulmonary/Chest: Effort normal and breath sounds normal. Right breast exhibits no inverted nipple, no mass, no nipple discharge, no skin change and no tenderness. Left breast exhibits no inverted nipple, no mass ( left breast cyst), no nipple discharge, no skin change and no tenderness.  Right breast with mild ecchymosis superior aspect  Abdominal: Soft. Normal appearance and bowel sounds are normal. There is no hepatomegaly. There is no tenderness. No hernia.  Lymphadenopathy:    She has no cervical adenopathy.  Neurological: She is alert and oriented to person, place, and time.  Skin: Skin is warm and dry.  Psychiatric: Her behavior is normal.    Data Reviewed Mammogram, [place] and and pathology  Assessment    Multiple cysts in left breast. 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     Plan   Pt advised fully on path report. The mass in right breast needs to be excised completely.  Procedure explained and pt is agreeable. Patient to be scheduled excision right breast mass at [facility].  This patient's surgery has been scheduled for 11-01-16.       This information has been scribed by Deborah Fetch RN, BSN,BC.   Chi Garlow G 10/26/2016, 9:00 AM

## 2016-11-01 NOTE — Anesthesia Procedure Notes (Signed)
Procedure Name: LMA Insertion Date/Time: 11/01/2016 7:28 AM Performed by: Hedda Slade Pre-anesthesia Checklist: Patient identified, Emergency Drugs available, Suction available and Patient being monitored Patient Re-evaluated:Patient Re-evaluated prior to inductionOxygen Delivery Method: Circle system utilized Preoxygenation: Pre-oxygenation with 100% oxygen Intubation Type: IV induction Ventilation: Mask ventilation without difficulty LMA: LMA inserted LMA Size: 3.5 Number of attempts: 1 Placement Confirmation: positive ETCO2 and breath sounds checked- equal and bilateral Tube secured with: Tape Dental Injury: Teeth and Oropharynx as per pre-operative assessment

## 2016-11-01 NOTE — OR Nursing (Signed)
Patient w/ naus & vomiting on arrival to preop.  Dr. Kayleen Memos by to see.  Phenergan 6.25mg  IV given as ordered.

## 2016-11-01 NOTE — Transfer of Care (Signed)
Immediate Anesthesia Transfer of Care Note  Patient: Deborah Mclean  Procedure(s) Performed: Procedure(s): BREAST LUMPECTOMY (Right)  Patient Location: PACU  Anesthesia Type:General  Level of Consciousness: sedated  Airway & Oxygen Therapy: Patient Spontanous Breathing and Patient connected to face mask oxygen  Post-op Assessment: Report given to RN and Post -op Vital signs reviewed and stable  Post vital signs: Reviewed and stable  Last Vitals:  Vitals:   11/01/16 0610 11/01/16 0819  BP: 120/69 105/70  Pulse: 70 64  Resp: 14 14  Temp: 36.6 C 36.7 C    Last Pain:  Vitals:   11/01/16 0819  TempSrc:   PainSc: Asleep         Complications: No apparent anesthesia complications

## 2016-11-01 NOTE — Discharge Instructions (Signed)

## 2016-11-01 NOTE — Anesthesia Preprocedure Evaluation (Addendum)
Anesthesia Evaluation  Patient identified by MRN, date of birth, ID band Patient awake    Reviewed: Allergy & Precautions, NPO status , Patient's Chart, lab work & pertinent test results  History of Anesthesia Complications (+) PONV and history of anesthetic complications  Airway Mallampati: II  TM Distance: >3 FB     Dental no notable dental hx.    Pulmonary neg pulmonary ROS,    Pulmonary exam normal        Cardiovascular negative cardio ROS Normal cardiovascular exam     Neuro/Psych negative neurological ROS  negative psych ROS   GI/Hepatic Neg liver ROS, GERD  Medicated and Controlled,  Endo/Other  negative endocrine ROS  Renal/GU stones  negative genitourinary   Musculoskeletal negative musculoskeletal ROS (+)   Abdominal Normal abdominal exam  (+)   Peds negative pediatric ROS (+)  Hematology negative hematology ROS (+)   Anesthesia Other Findings   Reproductive/Obstetrics                            Anesthesia Physical Anesthesia Plan  ASA: II  Anesthesia Plan: General   Post-op Pain Management:    Induction: Intravenous  Airway Management Planned: LMA  Additional Equipment:   Intra-op Plan:   Post-operative Plan: Extubation in OR  Informed Consent: I have reviewed the patients History and Physical, chart, labs and discussed the procedure including the risks, benefits and alternatives for the proposed anesthesia with the patient or authorized representative who has indicated his/her understanding and acceptance.   Dental advisory given  Plan Discussed with: CRNA and Surgeon  Anesthesia Plan Comments:         Anesthesia Quick Evaluation

## 2016-11-01 NOTE — Anesthesia Postprocedure Evaluation (Signed)
Anesthesia Post Note  Patient: Deborah Mclean  Procedure(s) Performed: Procedure(s) (LRB): BREAST LUMPECTOMY (Right)  Patient location during evaluation: PACU Anesthesia Type: General Level of consciousness: awake and alert Pain management: pain level controlled Vital Signs Assessment: post-procedure vital signs reviewed and stable Respiratory status: spontaneous breathing Cardiovascular status: blood pressure returned to baseline Anesthetic complications: no     Last Vitals:  Vitals:   11/01/16 0906 11/01/16 0958  BP: 139/69 104/71  Pulse: 64 60  Resp: 16 16  Temp: 36.2 C 37.1 C    Last Pain:  Vitals:   11/01/16 0958  TempSrc: Temporal  PainSc:                  Kona Lover

## 2016-11-02 LAB — SURGICAL PATHOLOGY

## 2016-11-06 ENCOUNTER — Telehealth: Payer: Self-pay | Admitting: *Deleted

## 2016-11-06 NOTE — Telephone Encounter (Signed)
-----   Message from Christene Lye, MD sent at 11/06/2016  1:21 PM EST ----- Rosann Auerbach, please inform pt-pathology was benign.

## 2016-11-07 ENCOUNTER — Encounter: Payer: Self-pay | Admitting: General Surgery

## 2016-11-07 ENCOUNTER — Ambulatory Visit (INDEPENDENT_AMBULATORY_CARE_PROVIDER_SITE_OTHER): Payer: BLUE CROSS/BLUE SHIELD | Admitting: General Surgery

## 2016-11-07 VITALS — BP 102/70 | HR 68 | Resp 14 | Ht 66.0 in | Wt 170.0 lb

## 2016-11-07 DIAGNOSIS — D241 Benign neoplasm of right breast: Secondary | ICD-10-CM

## 2016-11-07 NOTE — Patient Instructions (Signed)
The patient is aware to call back for any questions or concerns.  

## 2016-11-07 NOTE — Telephone Encounter (Signed)
Patient called back and I gave her the results of pathology. Patient verbally understood

## 2016-11-07 NOTE — Progress Notes (Signed)
Patient ID: Deborah Mclean, female   DOB: [birth year], 46 y.o.   MRN: [id number]  Chief Complaint  Patient presents with  . Routine Post Op    HPI Deborah Mclean is a 46 y.o. female.  Here today for her postoperative visit, right breast lumpectomy on 11-01-16. She states she is doing well. I have reviewed the history of present illness with the patient.  HPI  Past Medical History:  Diagnosis Date  . GERD (gastroesophageal reflux disease)   . History of kidney stones    2006, 2012  . PONV (postoperative nausea and vomiting)     Past Surgical History:  Procedure Laterality Date  . ABDOMINAL SURGERY  2010  . APPENDECTOMY  1997  . BREAST BIOPSY Right 10/16/2016   FIBROEPITHELIAL LESION WITH PROMINENT INTRACANALICULAR GROWTH PATTERN  . 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 /Procedure: BREAST LUMPECTOMY;  Surgeon: Christene Lye, MD;  Location: [facility] ORS;  Service: General;  Laterality: Right;  . CESAREAN SECTION  2009, 2012    Family History  Problem Relation Age of Onset  . Hyperlipidemia Mother   . Hypertension Father   . Hyperlipidemia Father   . Breast cancer Neg Hx     Social History Social History  Substance Use Topics  . Smoking status: Never Smoker  . Smokeless tobacco: Never Used  . Alcohol use No    Allergies  Allergen Reactions  . Amoxicillin-Pot Clavulanate Anaphylaxis    Has patient had a PCN reaction causing immediate rash, facial/tongue/throat swelling, SOB or lightheadedness with hypotension: Yes Has patient had a PCN reaction causing severe rash involving mucus membranes or skin necrosis: No Has patient had a PCN reaction that required hospitalization No Has patient had a PCN reaction occurring within the last 10 years: Yes If all of the above answers are "NO", then may proceed with Cephalosporin use.     Current Outpatient Prescriptions  Medication Sig Dispense Refill  . esomeprazole (NEXIUM) 20 MG  capsule Take 20 mg by mouth daily as needed.    Marland Kitchen ibuprofen (ADVIL,MOTRIN) 200 MG tablet Take 200 mg by mouth daily as needed for headache.    . Multiple Vitamin (MULTIVITAMIN) capsule Take 1 capsule by mouth daily.    Marland Kitchen SIMETHICONE PO Take by mouth.    . Triamcinolone Acetonide (NASACORT ALLERGY 24HR NA) Place 1 spray into the nose daily as needed (allergies).     No current facility-administered medications for this visit.     Review of Systems Review of Systems  Constitutional: Negative.   Respiratory: Negative.   Cardiovascular: Negative.     Blood pressure 102/70, pulse 68, resp. rate 14, height 5\' 6"  (1.676 m), weight 170 lb (77.1 kg), last menstrual period 10/20/2016.  Physical Exam Physical Exam  Constitutional: She is oriented to person, place, and time. She appears well-developed and well-nourished.  Pulmonary/Chest:  dermabond intact right breast.  Neurological: She is alert and oriented to person, place, and time.  Skin: Skin is warm and dry.  Psychiatric: Her behavior is normal.    Data Reviewed Prior notes Path- benign Phylloides, margins clear Assessment    Stable postoperative visit, benign phyllodes tumor right breast.    Plan    Follow up in 4 months. The patient is aware to call back for any questions or concerns.      This information has been scribed by Karie Fetch RN, BSN,BC.   SANKAR,SEEPLAPUTHUR G 11/08/2016, 11:32 AM

## 2016-11-08 ENCOUNTER — Ambulatory Visit: Payer: Self-pay | Admitting: General Surgery

## 2017-03-05 ENCOUNTER — Encounter: Payer: Self-pay | Admitting: *Deleted

## 2017-03-12 ENCOUNTER — Ambulatory Visit: Payer: Self-pay | Admitting: General Surgery

## 2017-03-18 ENCOUNTER — Ambulatory Visit (INDEPENDENT_AMBULATORY_CARE_PROVIDER_SITE_OTHER): Payer: BLUE CROSS/BLUE SHIELD | Admitting: General Surgery

## 2017-03-18 ENCOUNTER — Encounter: Payer: Self-pay | Admitting: General Surgery

## 2017-03-18 VITALS — BP 120/78 | HR 78 | Resp 12 | Ht 62.0 in | Wt 173.0 lb

## 2017-03-18 DIAGNOSIS — D241 Benign neoplasm of right breast: Secondary | ICD-10-CM

## 2017-03-18 NOTE — Patient Instructions (Signed)
The patient has been asked to return to the office in 6 months with a bilateral screening mammogram.

## 2017-03-18 NOTE — Progress Notes (Signed)
Patient ID: Deborah Mclean, female   DOB: [birth year], 46 y.o.   MRN: [id number]  Chief Complaint  Patient presents with  . Follow-up    HPI Deborah Mclean is a 46 y.o. female here today for her follow up right breast lumpectomy done on 11/01/2016. Patient states she is doing well.  HPI  Past Medical History:  Diagnosis Date  . GERD (gastroesophageal reflux disease)   . History of kidney stones    2006, 2012  . PONV (postoperative nausea and vomiting)     Past Surgical History:  Procedure Laterality Date  . ABDOMINAL SURGERY  2010  . APPENDECTOMY  1997  . BREAST BIOPSY Right 10/16/2016   FIBROEPITHELIAL LESION WITH PROMINENT INTRACANALICULAR GROWTH PATTERN  . 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 /Procedure: BREAST LUMPECTOMY;  Surgeon: Christene Lye, MD;  Location: [facility] ORS;  Service: General;  Laterality: Right;  . CESAREAN SECTION  2009, 2012    Family History  Problem Relation Age of Onset  . Hyperlipidemia Mother   . Hypertension Father   . Hyperlipidemia Father   . Breast cancer Neg Hx     Social History Social History  Substance Use Topics  . Smoking status: Never Smoker  . Smokeless tobacco: Never Used  . Alcohol use No    Allergies  Allergen Reactions  . Amoxicillin-Pot Clavulanate Anaphylaxis    Has patient had a PCN reaction causing immediate rash, facial/tongue/throat swelling, SOB or lightheadedness with hypotension: Yes Has patient had a PCN reaction causing severe rash involving mucus membranes or skin necrosis: No Has patient had a PCN reaction that required hospitalization No Has patient had a PCN reaction occurring within the last 10 years: Yes If all of the above answers are "NO", then may proceed with Cephalosporin use.     Current Outpatient Prescriptions  Medication Sig Dispense Refill  . esomeprazole (NEXIUM) 20 MG capsule Take 20 mg by mouth daily as needed.    Marland Kitchen ibuprofen (ADVIL,MOTRIN)  200 MG tablet Take 200 mg by mouth daily as needed for headache.    . Multiple Vitamin (MULTIVITAMIN) capsule Take 1 capsule by mouth daily.    Marland Kitchen SIMETHICONE PO Take by mouth.    . Triamcinolone Acetonide (NASACORT ALLERGY 24HR NA) Place 1 spray into the nose daily as needed (allergies).     No current facility-administered medications for this visit.     Review of Systems Review of Systems  Constitutional: Negative.   Respiratory: Negative.   Cardiovascular: Negative.     Blood pressure 120/78, pulse 78, resp. rate 12, height 5\' 2"  (1.575 m), weight 173 lb (78.5 kg), last menstrual period 03/18/2017.  Physical Exam Physical Exam  Constitutional: She is oriented to person, place, and time. She appears well-developed and well-nourished.  Eyes: Conjunctivae are normal. No scleral icterus.  Neck: Neck supple.  Cardiovascular: Normal rate, regular rhythm and normal heart sounds.   Pulmonary/Chest: Effort normal and breath sounds normal. Right breast exhibits no inverted nipple, no mass, no nipple discharge, no skin change and no tenderness. Left breast exhibits no inverted nipple, no mass, no nipple discharge, no skin change and no tenderness.    Abdominal: Soft. Bowel sounds are normal. There is no tenderness.  Lymphadenopathy:    She has no cervical adenopathy.    She has no axillary adenopathy.  Neurological: She is alert and oriented to person, place, and time.  Skin: Skin is warm.    Data Reviewed Prior notes  reviewed   Assessment    Stable exam. 75mo post excision of benign Phylloides tumor right breast    Plan    The patient has been asked to return to the office in 6 months with a bilateral screening mammogram.  HPI, Physical Exam, Assessment and Plan have been scribed under the direction and in the presence of Mckinley Jewel, MD  Gaspar Cola, CMA     I have completed the exam and reviewed the above documentation for accuracy and completeness.  I agree with the  above.  Haematologist has been used and any errors in dictation or transcription are unintentional.  Cassidie Veiga G. Jamal Collin, M.D., F.A.C.S.    Junie Panning G 03/18/2017, 3:49 PM

## 2017-04-11 IMAGING — US US BREAST*L* LIMITED INC AXILLA
1 series · 12 of 25 positions shown · non-contrast
Comparison: None.

CLINICAL DATA: Patient noted nodularity within the upper inner
quadrant of the left breast. There is no family history of breast
cancer.

EXAM:
2D DIGITAL DIAGNOSTIC BILATERAL MAMMOGRAM WITH CAD AND ADJUNCT TOMO
ULTRASOUND BILATERAL BREAST

[Series 1: us breast*left* limited inc axilla · 0.07mm/px · 12 of 27 slices shown]
[im 2/27]
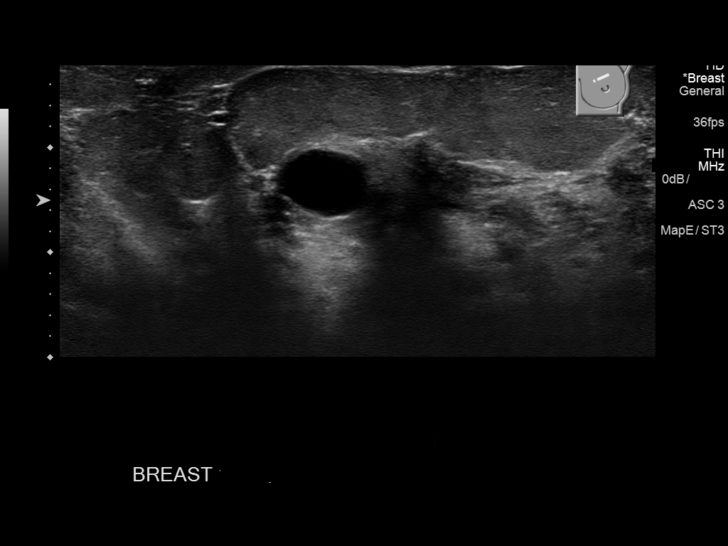
[im 4/27]
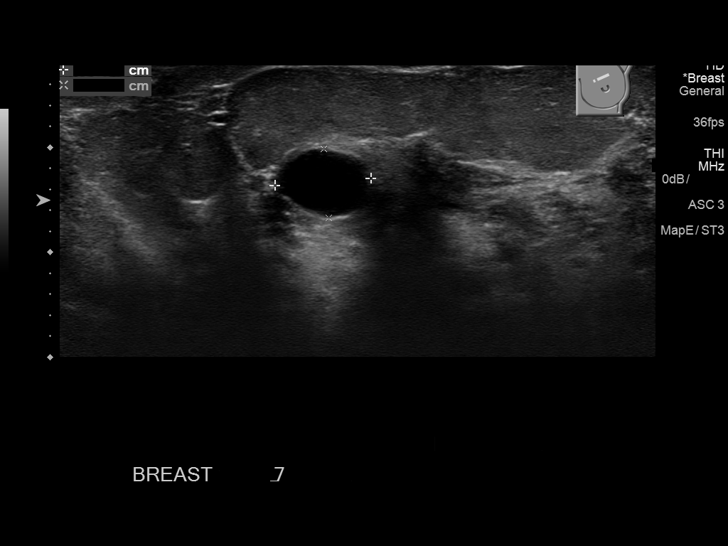
[im 6/27]
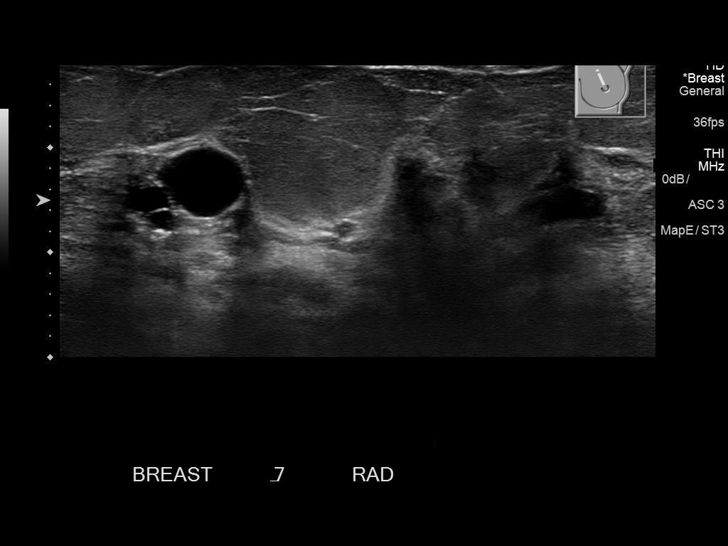
[im 8/27]
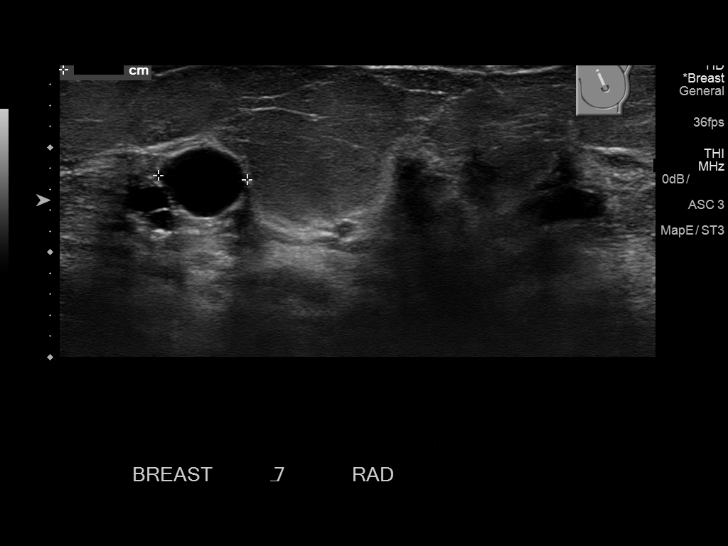
[im 10/27]
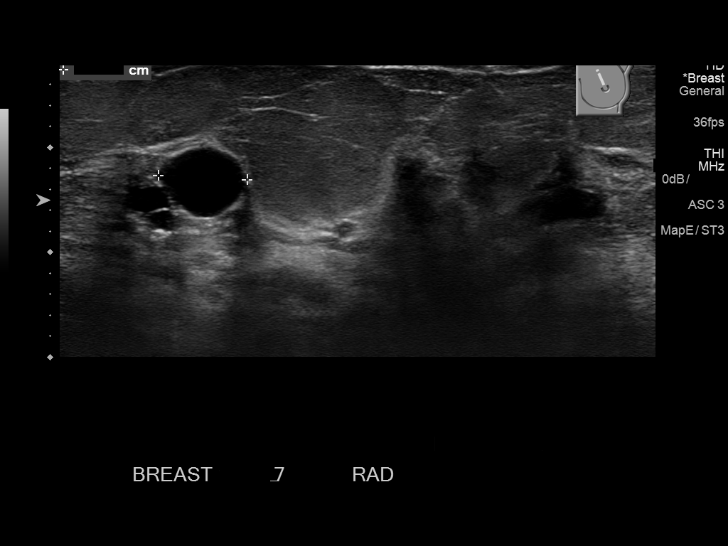
[im 12/27]
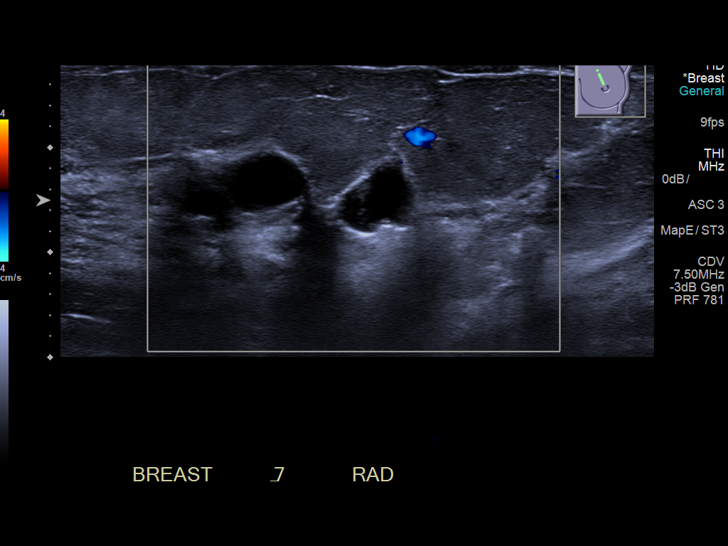
[im 15/27]
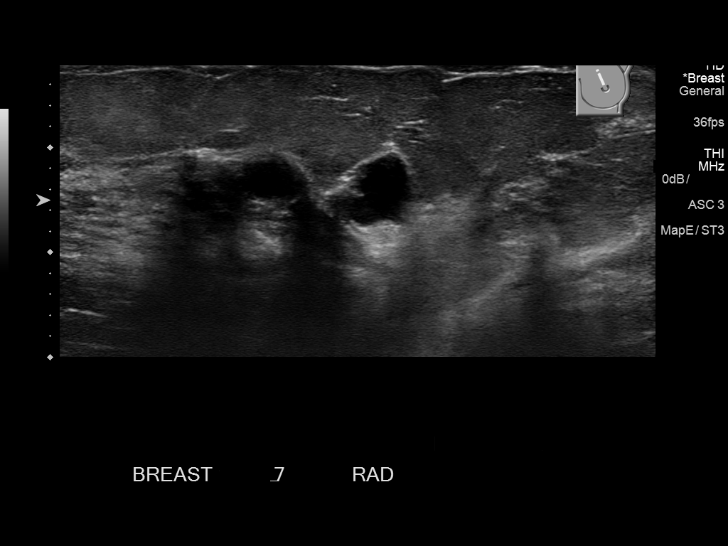
[im 17/27]
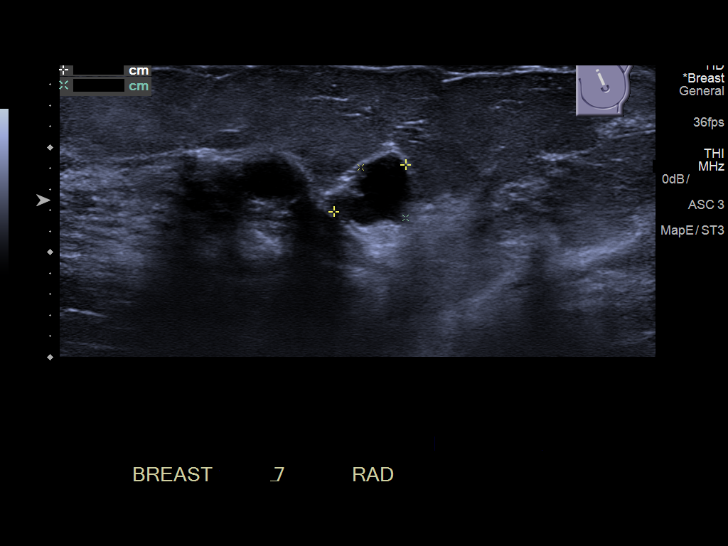
[im 19/27]
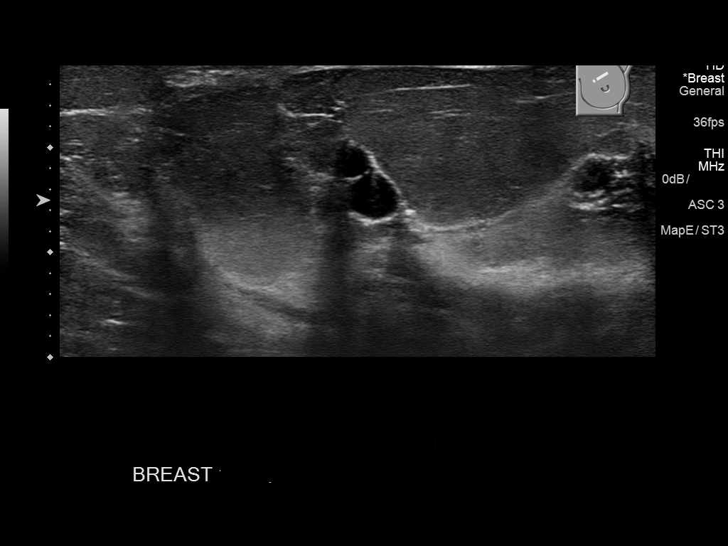
[im 21/27]
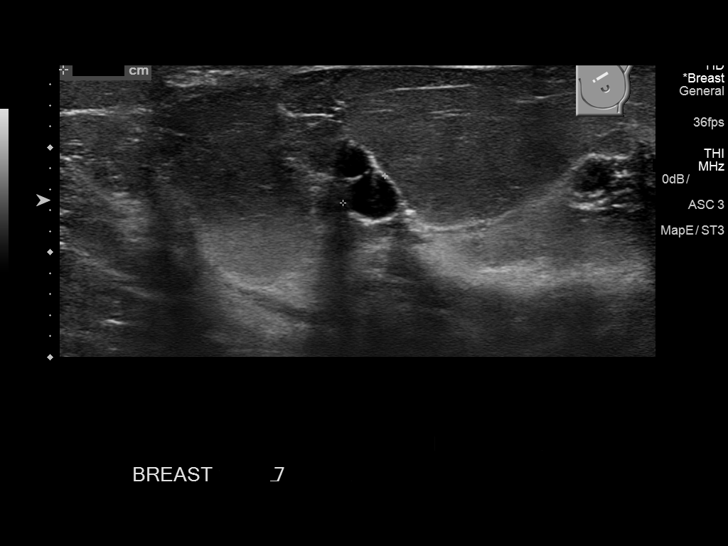
[im 23/27]
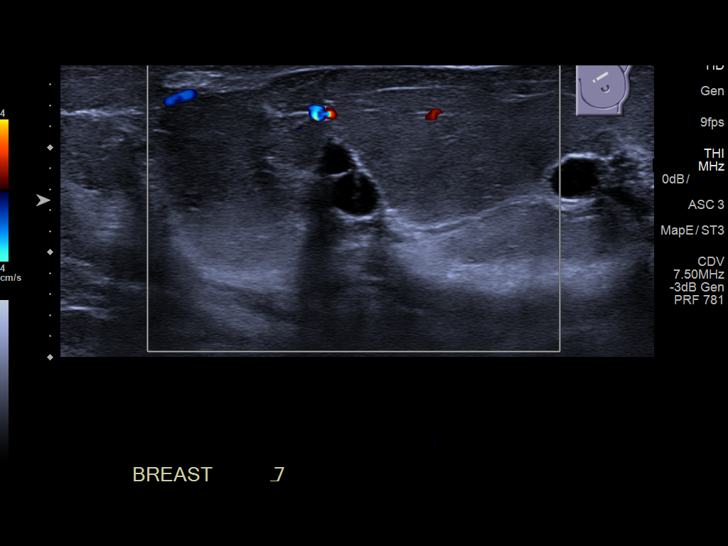
[im 25/27]
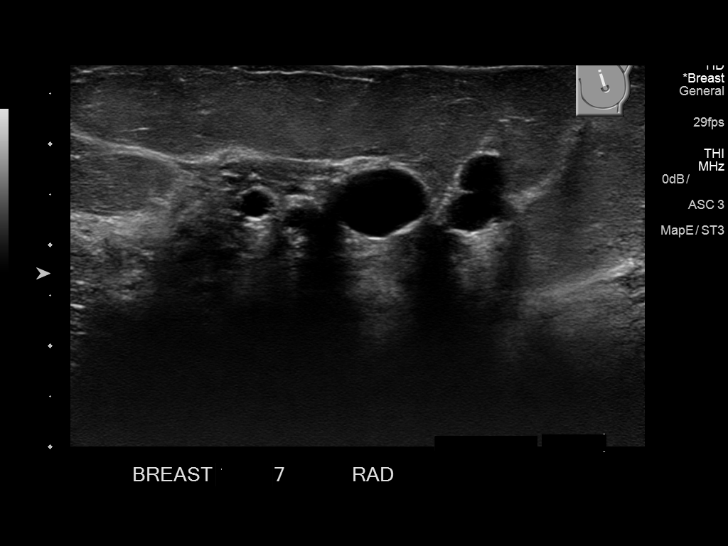

[12 of 25 positions shown; findings below may reference images not displayed]

This is the patient's baseline study.

ACR Breast Density Category c: The breast tissue is heterogeneously
dense, which may obscure small masses.
FINDINGS: There is a mass located superiorly within the right breast with an
angular margin posteriorly. There is asymmetric density within the
upper inner quadrant of the left breast in the area of palpable
asymmetry noted by the patient. There are several small partially
obscured, oval mass is seen in this region. There are bilateral
diffuse punctate calcifications present centrally and within the
upper-outer quadrants of the breasts bilaterally. On magnification
views, many of these calcifications layer consistent with milk of
calcium. There are no worrisome linear or branching forms.

Mammographic images were processed with CAD.

On physical exam, there is thickening located within the upper inner
quadrant of the left breast. There is no discrete palpable mass
within the left breast. There is a mobile, palpable mass located
within the right breast at approximately the 12 to 12:30 o'clock
position 9 cm from the nipple which by physical examination measures
approximately 1.5 cm in size. There is no palpable axillary
adenopathy.

Targeted ultrasound is performed, showing an oval, circumscribed,
parallel hypoechoic mass located within the right breast at the 12
o'clock to 12:30 o'clock position 9 cm from the nipple corresponding
to the mammographic and palpable finding in this region. There is an
angular margin posteriorly. There is internal color flow noted on
color-flow imaging. This measures 1.6 x 1.4 x 1.1 cm in size. I
recommend tissue sampling via ultrasound-guided core biopsy.
Ultrasound of the right axilla demonstrates normal axillary contents
and no evidence for adenopathy.

Ultrasound of the left breast demonstrates numerous benign-appearing
cysts within the central left breast and also within the upper inner
quadrant of the left breast. The largest cyst measures 9 mm in size.
There is no mass, distortion, or worrisome shadowing.
IMPRESSION: 1. 1.6 cm mildly suspicious mass located within the right breast at
the 12:30 o'clock position 9 cm from the nipple. Tissue sampling via
ultrasound-guided core biopsy is recommended and will be scheduled.
2. Multiple benign-appearing cysts located within the central and
superior left breast extending into the upper inner quadrant. This
corresponds to the area of asymmetry noted on the mammogram.
3. Multiple bilateral benign-appearing calcifications consistent
with benign fibrocystic calcifications.

RECOMMENDATION:
Right breast ultrasound-guided core biopsy.

I have discussed the findings and recommendations with the patient.
Results were also provided in writing at the conclusion of the
visit. If applicable, a reminder letter will be sent to the patient
regarding the next appointment.

BI-RADS CATEGORY  4: Suspicious.

## 2017-04-11 IMAGING — US US BREAST*R* LIMITED INC AXILLA
1 series · 12 of 13 positions shown · non-contrast
Comparison: None.

CLINICAL DATA: Patient noted nodularity within the upper inner
quadrant of the left breast. There is no family history of breast
cancer.

EXAM:
2D DIGITAL DIAGNOSTIC BILATERAL MAMMOGRAM WITH CAD AND ADJUNCT TOMO
ULTRASOUND BILATERAL BREAST

[Series 1: us breast*right* limited inc axilla · 0.07mm/px · 12 of 13 slices shown]
[im 1/13]
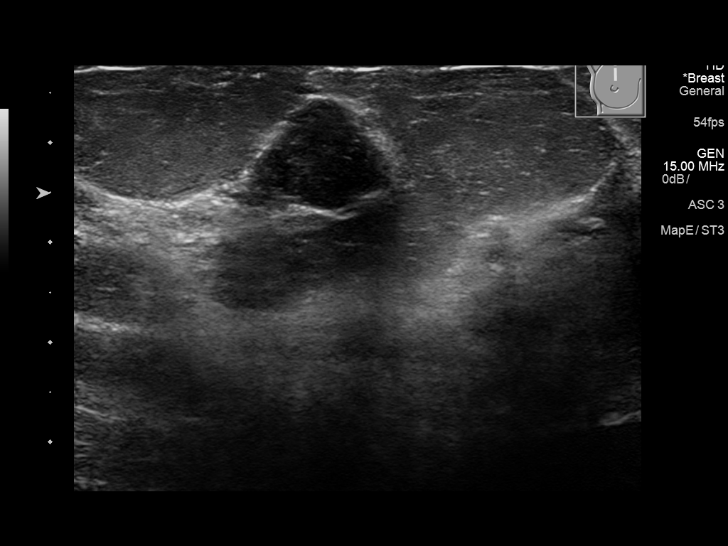
[im 2/13]
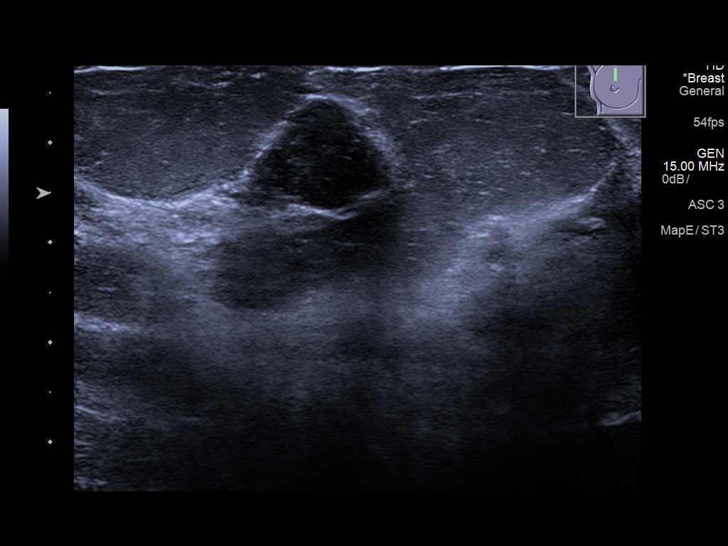
[im 3/13]
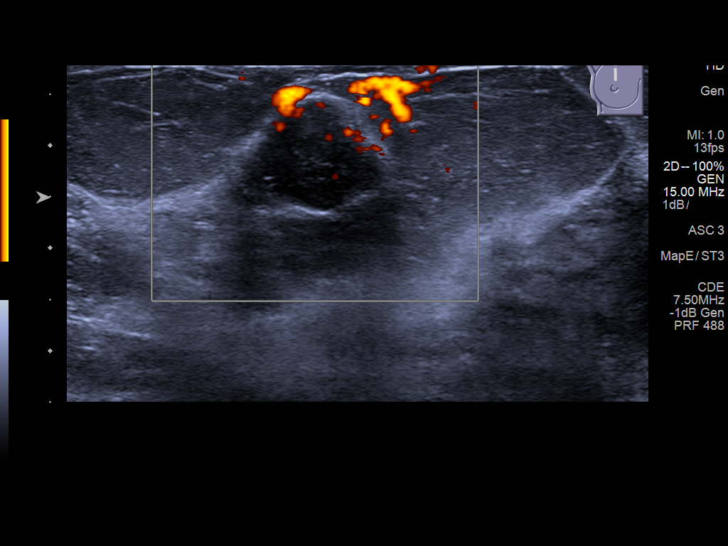
[im 4/13]
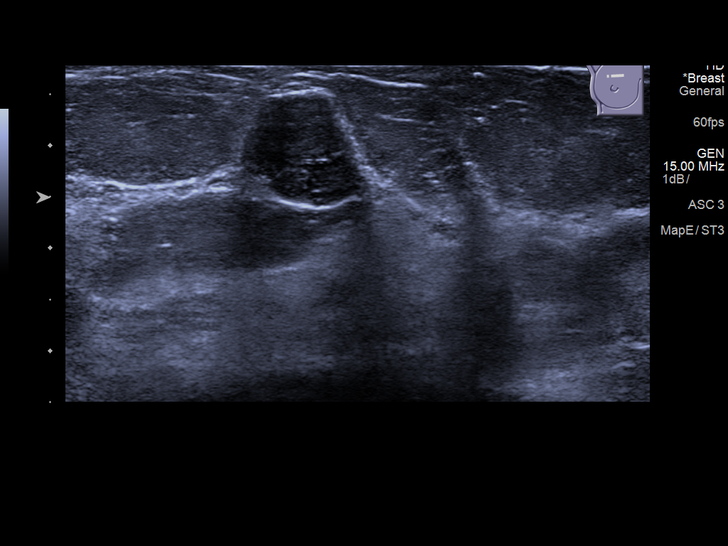
[im 5/13]
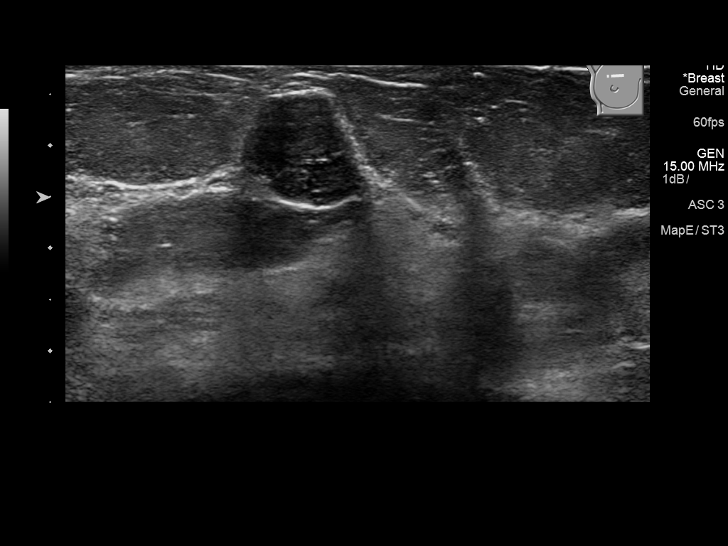
[im 6/13]
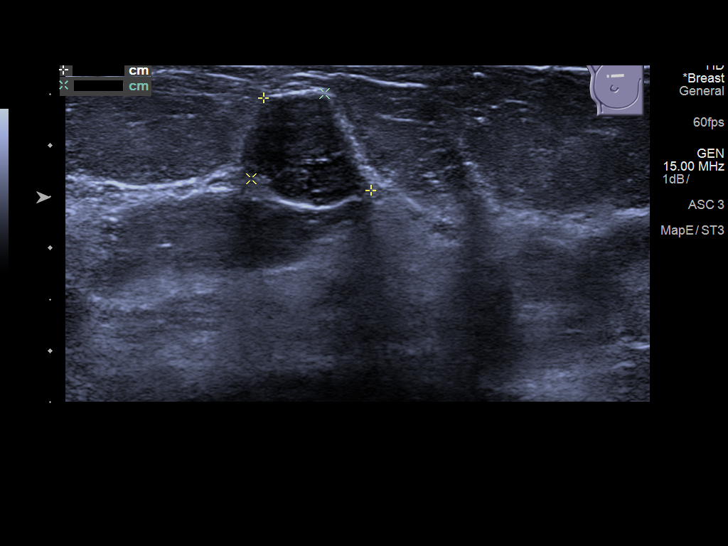
[im 8/13]
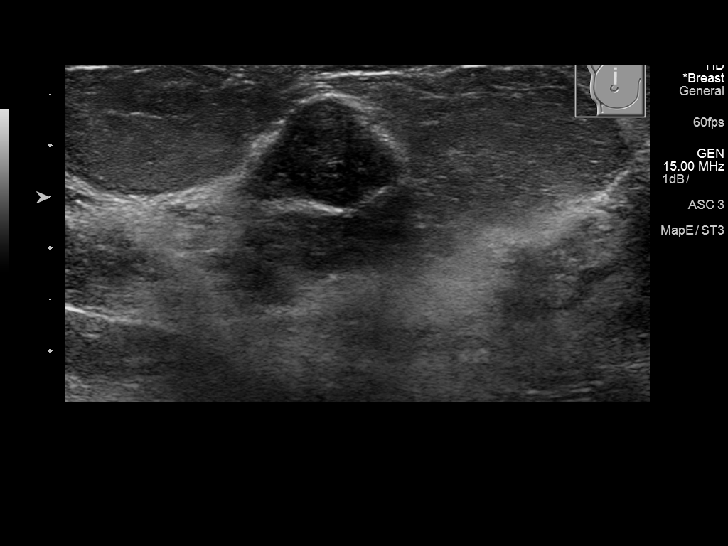
[im 9/13]
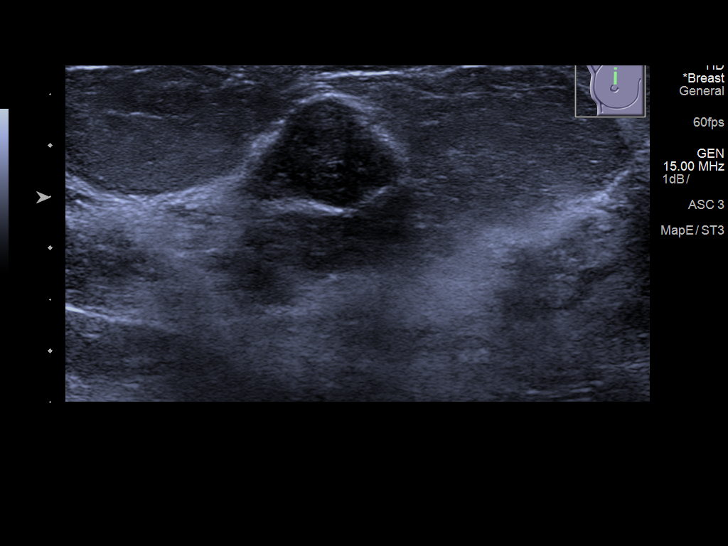
[im 10/13]
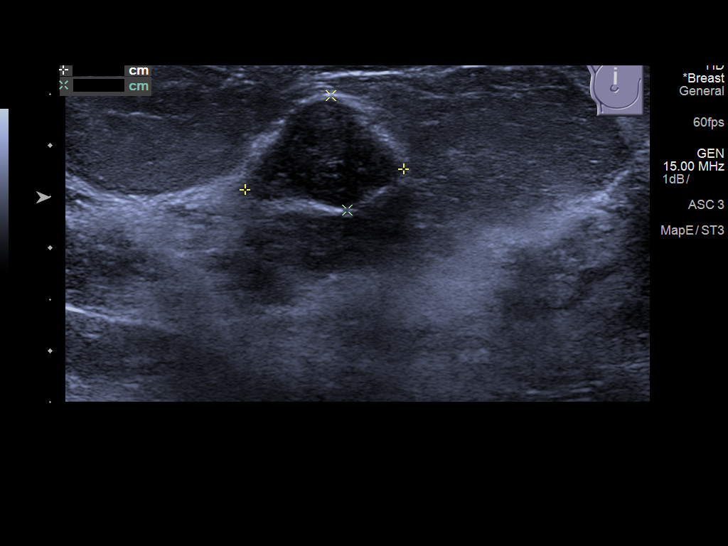
[im 11/13]
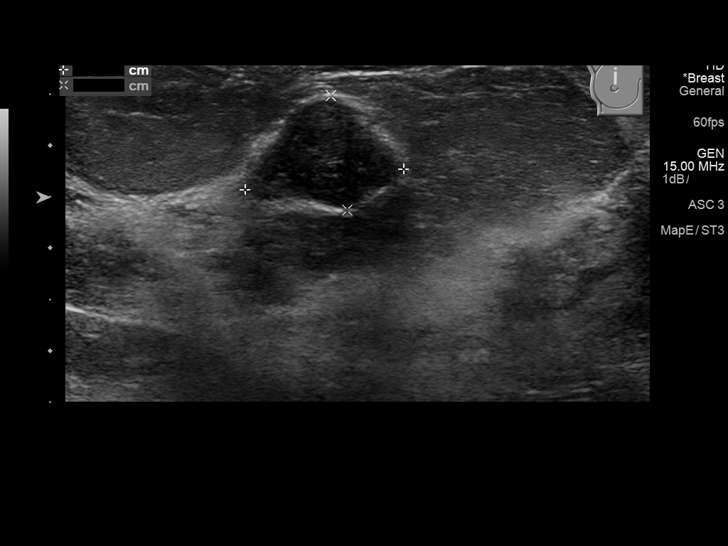
[im 12/13]
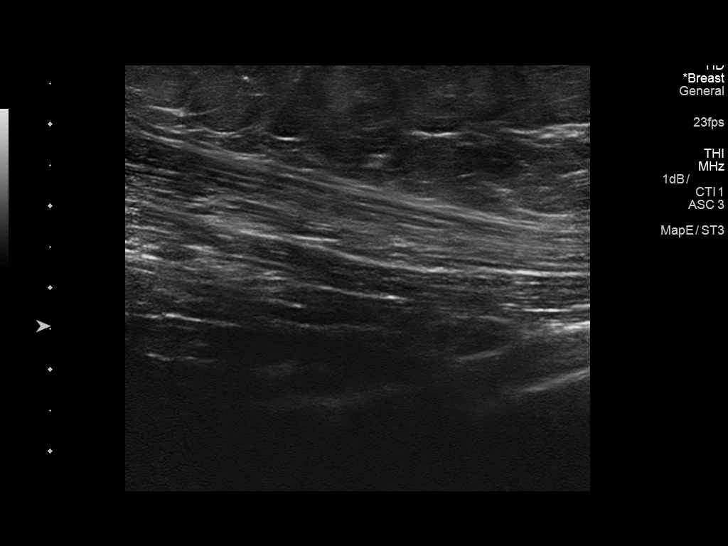
[im 13/13]
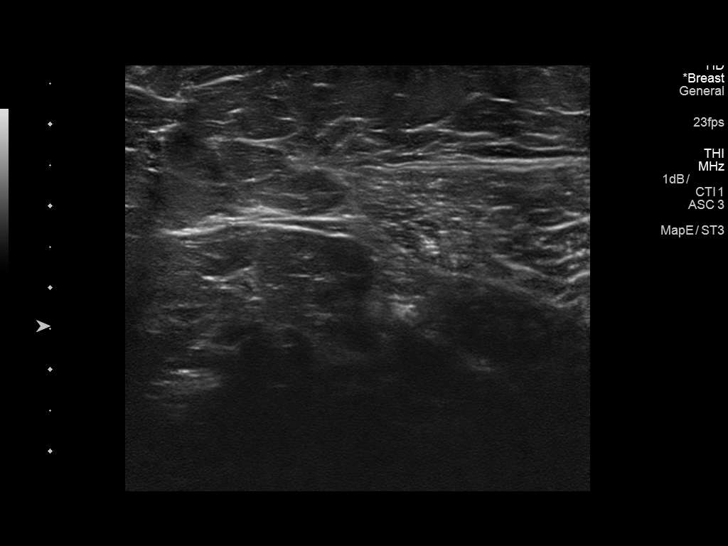

[12 of 13 positions shown; findings below may reference images not displayed]

This is the patient's baseline study.

ACR Breast Density Category c: The breast tissue is heterogeneously
dense, which may obscure small masses.
FINDINGS: There is a mass located superiorly within the right breast with an
angular margin posteriorly. There is asymmetric density within the
upper inner quadrant of the left breast in the area of palpable
asymmetry noted by the patient. There are several small partially
obscured, oval mass is seen in this region. There are bilateral
diffuse punctate calcifications present centrally and within the
upper-outer quadrants of the breasts bilaterally. On magnification
views, many of these calcifications layer consistent with milk of
calcium. There are no worrisome linear or branching forms.

Mammographic images were processed with CAD.

On physical exam, there is thickening located within the upper inner
quadrant of the left breast. There is no discrete palpable mass
within the left breast. There is a mobile, palpable mass located
within the right breast at approximately the 12 to 12:30 o'clock
position 9 cm from the nipple which by physical examination measures
approximately 1.5 cm in size. There is no palpable axillary
adenopathy.

Targeted ultrasound is performed, showing an oval, circumscribed,
parallel hypoechoic mass located within the right breast at the 12
o'clock to 12:30 o'clock position 9 cm from the nipple corresponding
to the mammographic and palpable finding in this region. There is an
angular margin posteriorly. There is internal color flow noted on
color-flow imaging. This measures 1.6 x 1.4 x 1.1 cm in size. I
recommend tissue sampling via ultrasound-guided core biopsy.
Ultrasound of the right axilla demonstrates normal axillary contents
and no evidence for adenopathy.

Ultrasound of the left breast demonstrates numerous benign-appearing
cysts within the central left breast and also within the upper inner
quadrant of the left breast. The largest cyst measures 9 mm in size.
There is no mass, distortion, or worrisome shadowing.
IMPRESSION: 1. 1.6 cm mildly suspicious mass located within the right breast at
the 12:30 o'clock position 9 cm from the nipple. Tissue sampling via
ultrasound-guided core biopsy is recommended and will be scheduled.
2. Multiple benign-appearing cysts located within the central and
superior left breast extending into the upper inner quadrant. This
corresponds to the area of asymmetry noted on the mammogram.
3. Multiple bilateral benign-appearing calcifications consistent
with benign fibrocystic calcifications.

RECOMMENDATION:
Right breast ultrasound-guided core biopsy.

I have discussed the findings and recommendations with the patient.
Results were also provided in writing at the conclusion of the
visit. If applicable, a reminder letter will be sent to the patient
regarding the next appointment.

BI-RADS CATEGORY  4: Suspicious.

## 2017-06-03 ENCOUNTER — Ambulatory Visit
Admission: EM | Admit: 2017-06-03 | Discharge: 2017-06-03 | Disposition: A | Discharge status: Home / Self Care | Payer: BLUE CROSS/BLUE SHIELD | Attending: Family Medicine | Admitting: Family Medicine

## 2017-06-03 ENCOUNTER — Ambulatory Visit (INDEPENDENT_AMBULATORY_CARE_PROVIDER_SITE_OTHER): Payer: BLUE CROSS/BLUE SHIELD

## 2017-06-03 DIAGNOSIS — R079 Chest pain, unspecified: Secondary | ICD-10-CM | POA: Diagnosis not present

## 2017-06-03 DIAGNOSIS — R202 Paresthesia of skin: Secondary | ICD-10-CM

## 2017-06-03 DIAGNOSIS — R918 Other nonspecific abnormal finding of lung field: Secondary | ICD-10-CM | POA: Insufficient documentation

## 2017-06-03 LAB — CBC WITH DIFFERENTIAL/PLATELET
BASOS ABS: 0.1 10*3/uL (ref 0–0.1)
BASOS PCT: 1 %
EOS ABS: 0.2 10*3/uL (ref 0–0.7)
EOS PCT: 3 %
HCT: 39.3 % (ref 35.0–47.0)
Hemoglobin: 13.6 g/dL (ref 12.0–16.0)
Lymphocytes Relative: 23 %
Lymphs Abs: 1.6 10*3/uL (ref 1.0–3.6)
MCH: 29.6 pg (ref 26.0–34.0)
MCHC: 34.5 g/dL (ref 32.0–36.0)
MCV: 85.8 fL (ref 80.0–100.0)
Monocytes Absolute: 0.4 10*3/uL (ref 0.2–0.9)
Monocytes Relative: 6 %
Neutro Abs: 4.6 10*3/uL (ref 1.4–6.5)
Neutrophils Relative %: 67 %
PLATELETS: 332 10*3/uL (ref 150–440)
RBC: 4.58 MIL/uL (ref 3.80–5.20)
RDW: 13.8 % (ref 11.5–14.5)
WBC: 6.9 10*3/uL (ref 3.6–11.0)

## 2017-06-03 LAB — BASIC METABOLIC PANEL
ANION GAP: 8 (ref 5–15)
BUN: 11 mg/dL (ref 6–20)
CALCIUM: 8.5 mg/dL — AB (ref 8.9–10.3)
CO2: 24 mmol/L (ref 22–32)
Chloride: 105 mmol/L (ref 101–111)
Creatinine, Ser: 0.59 mg/dL (ref 0.44–1.00)
GFR calc Af Amer: >60 mL/min (ref >60)
Glucose, Bld: 94 mg/dL (ref 65–99)
Potassium: 3.5 mmol/L (ref 3.5–5.1)
SODIUM: 137 mmol/L (ref 135–145)

## 2017-06-03 LAB — TSH: TSH: 0.549 u[IU]/mL (ref 0.350–4.500)

## 2017-06-03 NOTE — ED Provider Notes (Signed)
[facility]    CSN: [id number] Arrival date & time: 06/03/17  1616     History   Chief Complaint Chief Complaint  Patient presents with  . Chest Pain  . Numbness    HPI Deborah Mclean is a 46 y.o. female.   The history is provided by the patient.  Chest Pain  Pain location:  Epigastric and substernal area Pain quality: aching   Pain radiates to:  Does not radiate Pain severity:  Mild Onset quality:  Sudden Duration:  30 minutes Timing:  Intermittent Progression:  Resolved Chronicity:  New Context: not breathing, not drug use, not eating, not intercourse, not lifting, not movement, not raising an arm, not at rest, not stress and not trauma   Relieved by:  Nothing Ineffective treatments:  Antacids Associated symptoms: heartburn and numbness   Associated symptoms: no abdominal pain, no AICD problem, no altered mental status, no anorexia, no anxiety, no back pain, no claudication, no cough, no diaphoresis, no dizziness, no dysphagia, no fatigue, no fever, no headache, no lower extremity edema, no nausea, no near-syncope, no orthopnea, no palpitations, no PND, no shortness of breath, no syncope, no vomiting and no weakness   Risk factors: no aortic disease, no birth control, no coronary artery disease, no diabetes mellitus, no high cholesterol, no hypertension, no immobilization, not female, not obese, not pregnant, no prior DVT/PE and no smoking     Past Medical History:  Diagnosis Date  . GERD (gastroesophageal reflux disease)   . History of kidney stones    2006, 2012  . PONV (postoperative nausea and vomiting)     There are no active problems to display for this patient.   Past Surgical History:  Procedure Laterality Date  . ABDOMINAL SURGERY  2010  . APPENDECTOMY  1997  . BREAST BIOPSY Right 10/16/2016   FIBROEPITHELIAL LESION WITH PROMINENT INTRACANALICULAR GROWTH PATTERN  . 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  /Procedure: BREAST LUMPECTOMY;  Surgeon: Christene Lye, MD;  Location: [facility] ORS;  Service: General;  Laterality: Right;  . CESAREAN SECTION  2009, 2012    OB History    Gravida Para Term Preterm AB Living   4 2     2      SAB TAB Ectopic Multiple Live Births   2              Obstetric Comments   1st Menstrual Cycle:  11  1st Pregnancy:  36        Home Medications    Prior to Admission medications   Medication Sig Start Date End Date Taking? Authorizing Provider  ibuprofen (ADVIL,MOTRIN) 200 MG tablet Take 200 mg by mouth daily as needed for headache.   Yes [provider]  esomeprazole (NEXIUM) 20 MG capsule Take 20 mg by mouth daily as needed.    [provider]  Multiple Vitamin (MULTIVITAMIN) capsule Take 1 capsule by mouth daily.    [provider]  SIMETHICONE PO Take by mouth.    [provider]  Triamcinolone Acetonide (NASACORT ALLERGY 24HR NA) Place 1 spray into the nose daily as needed (allergies).    [provider]    Family History Family History  Problem Relation Age of Onset  . Hyperlipidemia Mother   . Hypertension Father   . Hyperlipidemia Father   . Breast cancer Neg Hx     Social History Social History  Substance Use Topics  . Smoking status: Never  Smoker  . Smokeless tobacco: Never Used  . Alcohol use No     Allergies   Amoxicillin-pot clavulanate   Review of Systems Review of Systems  Constitutional: Negative for diaphoresis, fatigue and fever.  HENT: Negative for trouble swallowing.   Respiratory: Negative for cough and shortness of breath.   Cardiovascular: Positive for chest pain. Negative for palpitations, orthopnea, claudication, syncope, PND and near-syncope.  Gastrointestinal: Positive for heartburn. Negative for abdominal pain, anorexia, nausea and vomiting.  Musculoskeletal: Negative for back pain.  Neurological: Positive for numbness. Negative for dizziness, weakness and  headaches.     Physical Exam Triage Vital Signs ED Triage Vitals  Enc Vitals Group     BP 06/03/17 1623 (!) 141/93     Pulse Rate 06/03/17 1623 71     Resp 06/03/17 1623 18     Temp 06/03/17 1623 97.6 F (36.4 C)     Temp Source 06/03/17 1623 Oral     SpO2 06/03/17 1623 100 %     Weight 06/03/17 1622 170 lb (77.1 kg)     Height 06/03/17 1622 5\' 6"  (1.676 m)     Head Circumference --      Peak Flow --      Pain Score 06/03/17 1623 9     Pain Loc --      Pain Edu? --      Excl. in [place]? --    No data found.   Updated Vital Signs BP (!) 141/93 (BP Location: Left Arm)   Pulse 71   Temp 97.6 F (36.4 C) (Oral)   Resp 18   Ht 5\' 6"  (1.676 m)   Wt 170 lb (77.1 kg)   LMP 05/24/2017 (Within Days) Comment: denies preg  SpO2 100%   BMI 27.44 kg/m   Visual Acuity Right Eye Distance:   Left Eye Distance:   Bilateral Distance:    Right Eye Near:   Left Eye Near:    Bilateral Near:     Physical Exam  Constitutional: She is oriented to person, place, and time. She appears well-developed and well-nourished. No distress.  HENT:  Head: Normocephalic.  Nose: Nose normal.  Mouth/Throat: Oropharynx is clear and moist and mucous membranes are normal.  Eyes: Pupils are equal, round, and reactive to light. Conjunctivae and EOM are normal. Right eye exhibits no discharge. Left eye exhibits no discharge. No scleral icterus.  Neck: Normal range of motion. Neck supple. No JVD present. No tracheal deviation present. No thyromegaly present.  Cardiovascular: Normal rate, regular rhythm, normal heart sounds and intact distal pulses.   No murmur heard. Pulmonary/Chest: Effort normal and breath sounds normal. No stridor. No respiratory distress. She has no wheezes. She has no rales. She exhibits no tenderness.  Abdominal: Soft. Bowel sounds are normal. She exhibits no distension and no mass. There is no tenderness. There is no rebound and no guarding.  Musculoskeletal: She exhibits no edema  or tenderness.  Lymphadenopathy:    She has no cervical adenopathy.  Neurological: She is alert and oriented to person, place, and time. She has normal reflexes. She displays normal reflexes. No cranial nerve deficit or sensory deficit. She exhibits normal muscle tone. Coordination normal.  Skin: Skin is warm and dry. No rash noted. She is not diaphoretic. No erythema. No pallor.  Psychiatric: She has a normal mood and affect. Her behavior is normal. Judgment and thought content normal.  Nursing note and vitals reviewed.    UC Treatments / Results  Labs (  all labs ordered are listed, but only abnormal results are displayed) Labs Reviewed  BASIC METABOLIC PANEL - Abnormal; Notable for the following:       Result Value   Calcium 8.5 (*)    All other components within normal limits  CBC WITH DIFFERENTIAL/PLATELET  TSH    EKG  EKG Interpretation None       Radiology No results found.  Procedures ED EKG Date/Time: 06/03/2017 5:00 PM Performed by: Norval Gable Authorized by: Norval Gable   ECG reviewed by ED Physician in the absence of a cardiologist: yes   Previous ECG:    Previous ECG:  Unavailable Interpretation:    Interpretation: normal   Rate:    ECG rate:  61   ECG rate assessment: normal   Rhythm:    Rhythm: sinus rhythm   Ectopy:    Ectopy: none   QRS:    QRS axis:  Normal Conduction:    Conduction: normal   ST segments:    ST segments:  Normal T waves:    T waves: normal      (including critical care time)  Medications Ordered in UC Medications - No data to display   Initial Impression / Assessment and Plan / UC Course  I have reviewed the triage vital signs and the nursing notes.  Pertinent labs & imaging results that were available during my care of the patient were reviewed by me and considered in my medical decision making (see chart for details).       Final Clinical Impressions(s) / UC Diagnoses   Final diagnoses:  Paresthesia    Chest pain, unspecified type  Hypocalcemia    New Prescriptions Discharge Medication List as of 06/03/2017  5:39 PM     1. Labs/x-ray/ekg results (negative/normal) and diagnosis reviewed with patient and husband 2.supportive treatment with otc GERD medication; increased calcium rich foods/supplements 3. Follow-up with PCP for possible further work up and possible referral   Norval Gable, MD 06/19/17 1505

## 2017-06-03 NOTE — ED Triage Notes (Signed)
Patient complains chest pain that felt like her bra was too tight. Patient states that she started having some facial numbness. Patient states that symptoms started around 30 minutes ago. Patient reports that pain was left side of chest and radiate upwards. Patient reports that pain has improved some.

## 2017-06-05 ENCOUNTER — Telehealth: Payer: Self-pay

## 2017-07-31 ENCOUNTER — Other Ambulatory Visit: Payer: Self-pay

## 2017-07-31 DIAGNOSIS — Z1231 Encounter for screening mammogram for malignant neoplasm of breast: Secondary | ICD-10-CM

## 2017-08-19 ENCOUNTER — Encounter: Payer: Self-pay | Admitting: Emergency Medicine

## 2017-08-19 ENCOUNTER — Ambulatory Visit
Admission: EM | Admit: 2017-08-19 | Discharge: 2017-08-19 | Disposition: A | Discharge status: Home / Self Care | Payer: BLUE CROSS/BLUE SHIELD | Attending: Family Medicine | Admitting: Family Medicine

## 2017-08-19 ENCOUNTER — Ambulatory Visit (INDEPENDENT_AMBULATORY_CARE_PROVIDER_SITE_OTHER): Payer: BLUE CROSS/BLUE SHIELD

## 2017-08-19 DIAGNOSIS — J019 Acute sinusitis, unspecified: Secondary | ICD-10-CM

## 2017-08-19 DIAGNOSIS — J069 Acute upper respiratory infection, unspecified: Secondary | ICD-10-CM | POA: Diagnosis not present

## 2017-08-19 DIAGNOSIS — R0602 Shortness of breath: Secondary | ICD-10-CM | POA: Diagnosis not present

## 2017-08-19 DIAGNOSIS — R05 Cough: Secondary | ICD-10-CM | POA: Diagnosis not present

## 2017-08-19 DIAGNOSIS — R059 Cough, unspecified: Secondary | ICD-10-CM

## 2017-08-19 MED ORDER — AZITHROMYCIN 250 MG PO TABS: 250.0000 mg | ORAL_TABLET | Freq: Every day | ORAL | 0 refills | Status: DC

## 2017-08-19 MED ORDER — BENZONATATE 100 MG PO CAPS: 100.0000 mg | ORAL_CAPSULE | Freq: Three times a day (TID) | ORAL | 0 refills | Status: DC

## 2017-08-19 MED ORDER — CETIRIZINE HCL 10 MG PO TABS: 10.0000 mg | ORAL_TABLET | Freq: Every day | ORAL | 0 refills | Status: AC

## 2017-08-19 NOTE — ED Triage Notes (Signed)
Patient in today c/o 6 weeks of cough so bad that she is vomiting mucous. The vomiting started on 08/16/17. Patient also c/o sinus congestion and pain and headache. Patient has tried OTC Flonase, Dayquil sinus both without relief.

## 2017-08-19 NOTE — Discharge Instructions (Signed)
-  azithromycin: Take 2 tablets by mouth the first day followed by one tablet daily for next 4 days. -cetirizine: one tablet daily -nasal sinus rinse to help open up passages (see attached) -push fluids -ibuprofen and Tylenol for pain

## 2017-08-19 NOTE — ED Provider Notes (Addendum)
[facility]    CSN: [id number] Arrival date & time: 08/19/17  1530     History   Chief Complaint Chief Complaint  Patient presents with  . Headache    sinus congestion    HPI Deborah Mclean is a 46 y.o. female.   Patient is a 46 year old female who presents today with complaint of severe cough 7 weeks as well as sinus pain and pressure that started last Thursday. Patient reports nothing has relieved her sinus pain and pressure has been showers. She denies fever, chills, and shortness of breath. She does report some chest pain related to her cough. She has been taking Nasacort nasal spray as well as some DayQuil sinus without much relief. Patient thought initially go away but has not. Patient reports the cough is starting to get worse last week with a sinus issue started. Reports that she'll get into coughing fits which will cause her to vomit mucus. Patient also reports the coughing and vomiting has not allowed her to eat in the last 3 days. Patient reports her coughs are occasionally productive of white mucus.   Patient also reports a rash along the left waistline that has been present for about 1-2 weeks. A picture of her phone with a rash from previous resembles hives. Patient reports that she has been treating this rash with cortisone ointment.      Past Medical History:  Diagnosis Date  . GERD (gastroesophageal reflux disease)   . History of kidney stones    2006, 2012  . PONV (postoperative nausea and vomiting)     There are no active problems to display for this patient.   Past Surgical History:  Procedure Laterality Date  . ABDOMINAL SURGERY  2010  . APPENDECTOMY  1997  . BREAST BIOPSY Right 10/16/2016   FIBROEPITHELIAL LESION WITH PROMINENT INTRACANALICULAR GROWTH PATTERN  . 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 /Procedure: BREAST LUMPECTOMY;  Surgeon: Christene Lye, MD;  Location: [facility] ORS;  Service:  General;  Laterality: Right;  . CESAREAN SECTION  2009, 2012    OB History    Gravida Para Term Preterm AB Living   4 2     2      SAB TAB Ectopic Multiple Live Births   2              Obstetric Comments   1st Menstrual Cycle:  11  1st Pregnancy:  36        Home Medications    Prior to Admission medications   Medication Sig Start Date End Date Taking? Authorizing Provider  Vitamin D, Ergocalciferol, (DRISDOL) 50000 units CAPS capsule Take 50,000 Units by mouth every 7 (seven) days.   Yes [provider]  azithromycin (ZITHROMAX Z-PAK) 250 MG tablet Take 1 tablet (250 mg total) by mouth daily. Take 2 tablets by mouth the first day followed by one tablet daily for next 4 days. 08/19/17   Luvenia Redden, PA-C  benzonatate (TESSALON) 100 MG capsule Take 1 capsule (100 mg total) by mouth every 8 (eight) hours. 08/19/17   Luvenia Redden, PA-C  cetirizine (ZYRTEC) 10 MG tablet Take 1 tablet (10 mg total) by mouth daily. 08/19/17   Luvenia Redden, PA-C  esomeprazole (NEXIUM) 20 MG capsule Take 20 mg by mouth daily as needed.    [provider]  ibuprofen (ADVIL,MOTRIN) 200 MG tablet Take 200 mg by mouth daily as needed for headache.    [provider]  Multiple Vitamin (MULTIVITAMIN) capsule Take 1 capsule by mouth daily.    [provider]  SIMETHICONE PO Take by mouth.    [provider]  Triamcinolone Acetonide (NASACORT ALLERGY 24HR NA) Place 1 spray into the nose daily as needed (allergies).    [provider]    Family History Family History  Problem Relation Age of Onset  . Hyperlipidemia Mother   . Hypertension Father   . Hyperlipidemia Father   . Breast cancer Neg Hx     Social History Social History  Substance Use Topics  . Smoking status: Never Smoker  . Smokeless tobacco: Never Used  . Alcohol use No     Allergies   Amoxicillin-pot clavulanate   Review of Systems Review of Systems   Physical  Exam Triage Vital Signs ED Triage Vitals  Enc Vitals Group     BP 08/19/17 1545 113/73     Pulse Rate 08/19/17 1545 80     Resp --      Temp 08/19/17 1545 97.6 F (36.4 C)     Temp Source 08/19/17 1545 Oral     SpO2 08/19/17 1545 98 %     Weight 08/19/17 1544 171 lb (77.6 kg)     Height 08/19/17 1544 5\' 6"  (1.676 m)     Head Circumference --      Peak Flow --      Pain Score 08/19/17 1544 7     Pain Loc --      Pain Edu? --      Excl. in [place]? --    No data found.   Updated Vital Signs BP 113/73 (BP Location: Right Arm)   Pulse 80   Temp 97.6 F (36.4 C) (Oral)   Ht 5\' 6"  (1.676 m)   Wt 171 lb (77.6 kg)   LMP 08/10/2017 (Approximate) Comment: denies preg  SpO2 98%   BMI 27.60 kg/m   Visual Acuity Right Eye Distance:   Left Eye Distance:   Bilateral Distance:    Right Eye Near:   Left Eye Near:    Bilateral Near:     Physical Exam  Constitutional: She is oriented to person, place, and time. She appears well-developed and well-nourished. No distress.  HENT:  Head: Normocephalic and atraumatic.  Right Ear: Tympanic membrane and ear canal normal.  Left Ear: Tympanic membrane and ear canal normal.  Nose: No rhinorrhea. Right sinus exhibits maxillary sinus tenderness and frontal sinus tenderness. Left sinus exhibits maxillary sinus tenderness and frontal sinus tenderness.  Mouth/Throat: Uvula is midline. Tonsils are 0 on the right. Tonsils are 0 on the left.  Postnasal drainage and mild throat redness  Eyes: Pupils are equal, round, and reactive to light. EOM are normal.  Neck: Normal range of motion. Neck supple.  Cardiovascular: Normal rate, regular rhythm and normal heart sounds.   No murmur heard. Pulmonary/Chest: Effort normal and breath sounds normal. No respiratory distress. She has no wheezes. She has no rales.  Abdominal: Soft. Bowel sounds are normal. There is no tenderness.  Musculoskeletal: Normal range of motion.  Neurological: She is alert and  oriented to person, place, and time. No cranial nerve deficit.  Skin: Skin is warm and dry. Capillary refill takes less than 2 seconds.  Papular rash left lower quadrant around the waistline, appears to be drying out with only a few papules noted.     UC Treatments / Results  Labs (all labs ordered are listed, but only abnormal  results are displayed) Labs Reviewed - No data to display  EKG  EKG Interpretation None       Radiology Dg Chest 2 View  Result Date: 08/19/2017 CLINICAL DATA:  Cough and shortness of breath EXAM: CHEST  2 VIEW COMPARISON:  06/03/2017 FINDINGS: The heart size and mediastinal contours are within normal limits. Both lungs are clear. The visualized skeletal structures are unremarkable. IMPRESSION: No active cardiopulmonary disease. Electronically Signed   By: Misty Stanley M.D.   On: 08/19/2017 16:30    Procedures Procedures (including critical care time)  Medications Ordered in UC Medications - No data to display   Initial Impression / Assessment and Plan / UC Course  I have reviewed the triage vital signs and the nursing notes.  Pertinent labs & imaging results that were available during my care of the patient were reviewed by me and considered in my medical decision making (see chart for details).    Patient with cough 7 weeks plus acute sinus symptoms that started last week sinus tenderness.  Final Clinical Impressions(s) / UC Diagnoses   Final diagnoses:  Acute sinusitis, recurrence not specified, unspecified location  Upper respiratory tract infection, unspecified type  Cough   We give patient prescription for azithromycin as well as Zyrtec and then Tessalon for her cough. Recommend over-the-counter medications for pain. Also recommend push fluids. Patient also given instructions for sinus rinse.  New Prescriptions New Prescriptions   AZITHROMYCIN (ZITHROMAX Z-PAK) 250 MG TABLET    Take 1 tablet (250 mg total) by mouth daily. Take 2  tablets by mouth the first day followed by one tablet daily for next 4 days.   BENZONATATE (TESSALON) 100 MG CAPSULE    Take 1 capsule (100 mg total) by mouth every 8 (eight) hours.   CETIRIZINE (ZYRTEC) 10 MG TABLET    Take 1 tablet (10 mg total) by mouth daily.     Controlled Substance Prescriptions [place] Controlled Substance Registry consulted? Not Applicable   Luvenia Redden, PA-C 08/19/17 1617    Luvenia Redden, PA-C 08/19/17 [phone]

## 2017-08-19 NOTE — ED Triage Notes (Signed)
After getting vital signs patient states that she has a rash on her left side of waist line x 1 week.

## 2017-10-04 ENCOUNTER — Ambulatory Visit
Admission: RE | Admit: 2017-10-04 | Discharge: 2017-10-04 | Disposition: A | Discharge status: Home / Self Care | Payer: BLUE CROSS/BLUE SHIELD | Source: Ambulatory Visit | Attending: General Surgery | Admitting: General Surgery

## 2017-10-04 DIAGNOSIS — Z1231 Encounter for screening mammogram for malignant neoplasm of breast: Secondary | ICD-10-CM | POA: Diagnosis present

## 2017-10-04 DIAGNOSIS — R928 Other abnormal and inconclusive findings on diagnostic imaging of breast: Secondary | ICD-10-CM | POA: Insufficient documentation

## 2017-10-07 ENCOUNTER — Other Ambulatory Visit: Payer: Self-pay | Admitting: General Surgery

## 2017-10-07 DIAGNOSIS — R928 Other abnormal and inconclusive findings on diagnostic imaging of breast: Secondary | ICD-10-CM

## 2017-10-09 ENCOUNTER — Ambulatory Visit: Payer: BLUE CROSS/BLUE SHIELD | Admitting: General Surgery

## 2017-10-17 ENCOUNTER — Ambulatory Visit
Admission: RE | Admit: 2017-10-17 | Discharge: 2017-10-17 | Disposition: A | Discharge status: Home / Self Care | Payer: BLUE CROSS/BLUE SHIELD | Source: Ambulatory Visit | Attending: General Surgery | Admitting: General Surgery

## 2017-10-17 DIAGNOSIS — Z9889 Other specified postprocedural states: Secondary | ICD-10-CM | POA: Insufficient documentation

## 2017-10-17 DIAGNOSIS — R928 Other abnormal and inconclusive findings on diagnostic imaging of breast: Secondary | ICD-10-CM

## 2017-10-21 ENCOUNTER — Ambulatory Visit: Payer: BLUE CROSS/BLUE SHIELD | Admitting: General Surgery

## 2017-10-23 ENCOUNTER — Ambulatory Visit: Payer: BLUE CROSS/BLUE SHIELD | Admitting: General Surgery

## 2017-10-23 ENCOUNTER — Encounter: Payer: Self-pay | Admitting: General Surgery

## 2017-10-23 VITALS — BP 128/74 | HR 82 | Resp 12 | Ht 66.0 in | Wt 178.0 lb

## 2017-10-23 DIAGNOSIS — D241 Benign neoplasm of right breast: Secondary | ICD-10-CM | POA: Diagnosis not present

## 2017-10-23 NOTE — Progress Notes (Signed)
Patient ID: SAAVI MCEACHRON, female   DOB: 05/31/1971, 46 y.o.   MRN: [id number]  Chief Complaint  Patient presents with  . Follow-up    HPI MATISON NUCCIO is a 46 y.o. female who presents for a breast evaluation. The most recent mammogram was done on 10/04/2017 with added views of the right breast on 10/17/17. Patient does perform regular self breast checks and gets regular mammograms done.    HPI  Past Medical History:  Diagnosis Date  . GERD (gastroesophageal reflux disease)   . History of kidney stones    2006, 2012  . PONV (postoperative nausea and vomiting)     Past Surgical History:  Procedure Laterality Date  . ABDOMINAL SURGERY  2010  . APPENDECTOMY  1997  . BREAST BIOPSY Right 10/16/2016   FIBROEPITHELIAL LESION WITH PROMINENT INTRACANALICULAR GROWTH PATTERN  . 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 /Procedure: BREAST LUMPECTOMY;  Surgeon: Christene Lye, MD;  Location: [facility] ORS;  Service: General;  Laterality: Right;  . CESAREAN SECTION  2009, 2012    Family History  Problem Relation Age of Onset  . Hyperlipidemia Mother   . Hypertension Father   . Hyperlipidemia Father   . Breast cancer Neg Hx     Social History Social History   Tobacco Use  . Smoking status: Never Smoker  . Smokeless tobacco: Never Used  Substance Use Topics  . Alcohol use: No  . Drug use: No    Allergies  Allergen Reactions  . Amoxicillin-Pot Clavulanate Anaphylaxis    Has patient had a PCN reaction causing immediate rash, facial/tongue/throat swelling, SOB or lightheadedness with hypotension: Yes Has patient had a PCN reaction causing severe rash involving mucus membranes or skin necrosis: No Has patient had a PCN reaction that required hospitalization No Has patient had a PCN reaction occurring within the last 10 years: Yes If all of the above answers are "NO", then may proceed with Cephalosporin use.     Current Outpatient  Medications  Medication Sig Dispense Refill  . cetirizine (ZYRTEC) 10 MG tablet Take 1 tablet (10 mg total) by mouth daily. 30 tablet 0  . esomeprazole (NEXIUM) 20 MG capsule Take 20 mg by mouth daily as needed.    Marland Kitchen ibuprofen (ADVIL,MOTRIN) 200 MG tablet Take 200 mg by mouth daily as needed for headache.    . Multiple Vitamin (MULTIVITAMIN) capsule Take 1 capsule by mouth daily.    Marland Kitchen SIMETHICONE PO Take by mouth.    . Triamcinolone Acetonide (NASACORT ALLERGY 24HR NA) Place 1 spray into the nose daily as needed (allergies).    . Vitamin D, Ergocalciferol, (DRISDOL) 50000 units CAPS capsule Take 50,000 Units by mouth every 7 (seven) days.     No current facility-administered medications for this visit.     Review of Systems Review of Systems  Constitutional: Negative.   Respiratory: Negative.   Cardiovascular: Negative.     Blood pressure 128/74, pulse 82, resp. rate 12, height 5\' 6"  (1.676 m), weight 178 lb (80.7 kg).  Physical Exam Physical Exam  Constitutional: She is oriented to person, place, and time. She appears well-developed and well-nourished.  Eyes: Conjunctivae are normal. No scleral icterus.  Neck: Neck supple.  Cardiovascular: Normal rate, regular rhythm and normal heart sounds.  Pulmonary/Chest: Effort normal and breath sounds normal. Right breast exhibits no inverted nipple, no mass, no nipple discharge, no skin change and no tenderness. Left breast exhibits no inverted nipple, no mass, no  nipple discharge, no skin change and no tenderness.  Lymphadenopathy:    She has no cervical adenopathy.    She has no axillary adenopathy.  Neurological: She is alert and oriented to person, place, and time.  Skin: Skin is warm and dry.  Psychiatric: She has a normal mood and affect.    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    Plan    Bilateral screening mammogram in one year  with PCP or GYN    HPI, Physical Exam, Assessment and Plan have been scribed under the direction and in the presence of Mckinley Jewel, MD  Concepcion Living, LPN  I have completed the exam and reviewed the above documentation for accuracy and completeness.  I agree with the above.  Haematologist has been used and any errors in dictation or transcription are unintentional.  Caedan Sumler G. Jamal Collin, M.D., F.A.C.S.   Junie Panning G 10/24/2017, 10:30 AM

## 2017-10-23 NOTE — Patient Instructions (Addendum)
Bilateral screening mammogram in one year with PCP or GYN Continue self breast exams. Call office for any new breast issues or concerns.

## 2017-12-31 ENCOUNTER — Other Ambulatory Visit: Payer: Self-pay

## 2017-12-31 ENCOUNTER — Encounter: Payer: Self-pay | Admitting: Emergency Medicine

## 2017-12-31 ENCOUNTER — Ambulatory Visit
Admission: EM | Admit: 2017-12-31 | Discharge: 2017-12-31 | Disposition: A | Discharge status: Home / Self Care | Payer: BLUE CROSS/BLUE SHIELD | Attending: Family Medicine | Admitting: Family Medicine

## 2017-12-31 DIAGNOSIS — J069 Acute upper respiratory infection, unspecified: Secondary | ICD-10-CM

## 2017-12-31 NOTE — ED Triage Notes (Signed)
Patient c/o runny nose, HAs, cough, fever and bodyaches that started on Sunday.

## 2017-12-31 NOTE — ED Provider Notes (Signed)
[facility]    CSN: [id number] Arrival date & time: 12/31/17  1053     History   Chief Complaint Chief Complaint  Patient presents with  . Nasal Congestion  . Fever  . Generalized Body Aches    HPI Deborah Mclean is a 47 y.o. female.   HPI  This 47 year old female presents with runny nose headache cough fever body aches that started on Sunday.  Fevers of 101.2 for 2 days in a row but today that it actually broke.  Had  shaking chills as well. Today she states that she is actually somewhat better.  She has complained of coughing however most of her problems seem to be in her sinuses along with her headache.  She has been using over-the-counter preparations but has not been using nasal steroids at the present time.  She has tried to use the Nettie pot in the past but has failed temperature today is 97.9 O2 sats on room air 100%.       Past Medical History:  Diagnosis Date  . GERD (gastroesophageal reflux disease)   . History of kidney stones    2006, 2012  . PONV (postoperative nausea and vomiting)     There are no active problems to display for this patient.   Past Surgical History:  Procedure Laterality Date  . ABDOMINAL SURGERY  2010  . APPENDECTOMY  1997  . BREAST BIOPSY Right 10/16/2016   FIBROEPITHELIAL LESION WITH PROMINENT INTRACANALICULAR GROWTH PATTERN  . 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 /Procedure: BREAST LUMPECTOMY;  Surgeon: Christene Lye, MD;  Location: [facility];  Service: General;  Laterality: Right;  . CESAREAN SECTION  2009, 2012    OB History    Gravida Para Term Preterm AB Living   4 2     2      SAB TAB Ectopic Multiple Live Births   2              Obstetric Comments   1st Menstrual Cycle:  11  1st Pregnancy:  36        Home Medications    Prior to Admission medications   Medication Sig Start Date End Date Taking? Authorizing Provider  cetirizine (ZYRTEC) 10 MG tablet  Take 1 tablet (10 mg total) by mouth daily. 08/19/17  Yes Luvenia Redden, PA-C  Multiple Vitamin (MULTIVITAMIN) capsule Take 1 capsule by mouth daily.   Yes [provider]  Vitamin D, Ergocalciferol, (DRISDOL) 50000 units CAPS capsule Take 50,000 Units by mouth every 7 (seven) days.   Yes [provider]  ibuprofen (ADVIL,MOTRIN) 200 MG tablet Take 200 mg by mouth daily as needed for headache.    [provider]  SIMETHICONE PO Take by mouth.    [provider]    Family History Family History  Problem Relation Age of Onset  . Hyperlipidemia Mother   . Hypertension Father   . Hyperlipidemia Father   . Breast cancer Neg Hx     Social History Social History   Tobacco Use  . Smoking status: Never Smoker  . Smokeless tobacco: Never Used  Substance Use Topics  . Alcohol use: No  . Drug use: No     Allergies   Amoxicillin-pot clavulanate   Review of Systems Review of Systems  Constitutional: Positive for activity change, chills, fatigue and fever.  HENT: Positive for congestion, postnasal drip, rhinorrhea, sinus pressure, sinus pain and sore throat.   Respiratory:  Positive for cough.   All other systems reviewed and are negative.    Physical Exam Triage Vital Signs ED Triage Vitals  Enc Vitals Group     BP 12/31/17 1106 109/80     Pulse Rate 12/31/17 1106 71     Resp 12/31/17 1106 16     Temp 12/31/17 1106 97.9 F (36.6 C)     Temp Source 12/31/17 1106 Oral     SpO2 12/31/17 1106 100 %     Weight 12/31/17 1104 165 lb (74.8 kg)     Height 12/31/17 1104 5\' 6"  (1.676 m)     Head Circumference --      Peak Flow --      Pain Score 12/31/17 1103 7     Pain Loc --      Pain Edu? --      Excl. in [place]? --    No data found.  Updated Vital Signs BP 109/80 (BP Location: Left Arm)   Pulse 71   Temp 97.9 F (36.6 C) (Oral)   Resp 16   Ht 5\' 6"  (1.676 m)   Wt 165 lb (74.8 kg)   LMP 12/24/2017 (Approximate)   SpO2 100%   BMI  26.63 kg/m   Visual Acuity Right Eye Distance:   Left Eye Distance:   Bilateral Distance:    Right Eye Near:   Left Eye Near:    Bilateral Near:     Physical Exam  Constitutional: She is oriented to person, place, and time. She appears well-developed and well-nourished. No distress.  HENT:  Head: Normocephalic.  Right Ear: External ear normal.  Left Ear: External ear normal.  Nose: Nose normal.  Mouth/Throat: Oropharynx is clear and moist. No oropharyngeal exudate.  She has mild tenderness to percussion over the frontal sinuses.  She does not have any tenderness over the maxillary  Eyes: Pupils are equal, round, and reactive to light. Right eye exhibits no discharge. Left eye exhibits no discharge.  Neck: Normal range of motion.  Pulmonary/Chest: Effort normal and breath sounds normal.  Musculoskeletal: Normal range of motion.  Lymphadenopathy:    She has no cervical adenopathy.  Neurological: She is alert and oriented to person, place, and time.  Skin: Skin is warm and dry. She is not diaphoretic.  Psychiatric: She has a normal mood and affect. Her behavior is normal. Judgment and thought content normal.  Nursing note and vitals reviewed.    UC Treatments / Results  Labs (all labs ordered are listed, but only abnormal results are displayed) Labs Reviewed - No data to display  EKG  EKG Interpretation None       Radiology No results found.  Procedures Procedures (including critical care time)  Medications Ordered in UC Medications - No data to display   Initial Impression / Assessment and Plan / UC Course  I have reviewed the triage vital signs and the nursing notes.  Pertinent labs & imaging results that were available during my care of the patient were reviewed by me and considered in my medical decision making (see chart for details).     Plan: 1. Test/x-ray results and diagnosis reviewed with patient 2. rx as per orders; risks, benefits, potential  side effects reviewed with patient 3. Recommend supportive treatment with rest and fluids.  Recommend using nasal steroids for drainage for 3-4 weeks.  Advised her that this is likely a virus does not require antibiotics at this time.  If her sx Continues for 2 weeks  where she runs high fevers along with dental or facial pain should return to our clinic for further evaluation. 4. F/u prn if symptoms worsen or don't improve   Final Clinical Impressions(s) / UC Diagnoses   Final diagnoses:  Upper respiratory tract infection, unspecified type    ED Discharge Orders    None       Controlled Substance Prescriptions [place] Controlled Substance Registry consulted? Not Applicable   Lorin Picket, PA-C 12/31/17 1140

## 2019-07-05 ENCOUNTER — Other Ambulatory Visit: Payer: Self-pay

## 2019-07-05 ENCOUNTER — Ambulatory Visit
Admission: EM | Admit: 2019-07-05 | Discharge: 2019-07-05 | Disposition: A | Discharge status: Home / Self Care | Payer: BLUE CROSS/BLUE SHIELD | Attending: Emergency Medicine | Admitting: Emergency Medicine

## 2019-07-05 ENCOUNTER — Encounter: Payer: Self-pay | Admitting: Emergency Medicine

## 2019-07-05 DIAGNOSIS — R1010 Upper abdominal pain, unspecified: Secondary | ICD-10-CM | POA: Diagnosis present

## 2019-07-05 DIAGNOSIS — K2900 Acute gastritis without bleeding: Secondary | ICD-10-CM

## 2019-07-05 LAB — CBC WITH DIFFERENTIAL/PLATELET
Abs Immature Granulocytes: 0.02 10*3/uL (ref 0.00–0.07)
Basophils Absolute: 0.1 10*3/uL (ref 0.0–0.1)
Basophils Relative: 1 %
Eosinophils Absolute: 0.4 10*3/uL (ref 0.0–0.5)
Eosinophils Relative: 6 %
HCT: 41.4 % (ref 36.0–46.0)
Hemoglobin: 14.3 g/dL (ref 12.0–15.0)
Immature Granulocytes: 0 %
Lymphocytes Relative: 22 %
Lymphs Abs: 1.6 10*3/uL (ref 0.7–4.0)
MCH: 29.2 pg (ref 26.0–34.0)
MCHC: 34.5 g/dL (ref 30.0–36.0)
MCV: 84.7 fL (ref 80.0–100.0)
Monocytes Absolute: 0.4 10*3/uL (ref 0.1–1.0)
Monocytes Relative: 6 %
Neutro Abs: 5 10*3/uL (ref 1.7–7.7)
Neutrophils Relative %: 65 %
Platelets: 318 10*3/uL (ref 150–400)
RBC: 4.89 MIL/uL (ref 3.87–5.11)
RDW: 13.2 % (ref 11.5–15.5)
WBC: 7.6 10*3/uL (ref 4.0–10.5)
nRBC: 0 % (ref 0.0–0.2)

## 2019-07-05 LAB — URINALYSIS, COMPLETE (UACMP) WITH MICROSCOPIC
Bilirubin Urine: NEGATIVE
Glucose, UA: NEGATIVE mg/dL
Hgb urine dipstick: NEGATIVE
Ketones, ur: NEGATIVE mg/dL
Leukocytes,Ua: NEGATIVE
Nitrite: NEGATIVE
Protein, ur: NEGATIVE mg/dL
Specific Gravity, Urine: 1.02 (ref 1.005–1.030)
pH: 7 (ref 5.0–8.0)

## 2019-07-05 LAB — COMPREHENSIVE METABOLIC PANEL
ALT: 19 U/L (ref 0–44)
AST: 17 U/L (ref 15–41)
Albumin: 3.9 g/dL (ref 3.5–5.0)
Alkaline Phosphatase: 55 U/L (ref 38–126)
Anion gap: 8 (ref 5–15)
BUN: 11 mg/dL (ref 6–20)
CO2: 23 mmol/L (ref 22–32)
Calcium: 9 mg/dL (ref 8.9–10.3)
Chloride: 106 mmol/L (ref 98–111)
Creatinine, Ser: 0.53 mg/dL (ref 0.44–1.00)
GFR calc Af Amer: >60 mL/min (ref >60)
GFR calc non Af Amer: >60 mL/min (ref >60)
Glucose, Bld: 86 mg/dL (ref 70–99)
Potassium: 3.6 mmol/L (ref 3.5–5.1)
Sodium: 137 mmol/L (ref 135–145)
Total Bilirubin: 0.5 mg/dL (ref 0.3–1.2)
Total Protein: 6.9 g/dL (ref 6.5–8.1)

## 2019-07-05 LAB — LIPASE, BLOOD: Lipase: 24 U/L (ref 11–51)

## 2019-07-05 MED ORDER — FAMOTIDINE 20 MG PO TABS: 20.0000 mg | ORAL_TABLET | Freq: Two times a day (BID) | ORAL | 0 refills | Status: DC

## 2019-07-05 MED ORDER — LIDOCAINE VISCOUS HCL 2 % MT SOLN
15.0000 mL | Freq: Once | OROMUCOSAL | Status: AC
  Administered 2019-07-05: 16:00:00 15 mL via ORAL

## 2019-07-05 MED ORDER — ALUM & MAG HYDROXIDE-SIMETH 200-200-20 MG/5ML PO SUSP
30.0000 mL | Freq: Once | ORAL | Status: AC
  Administered 2019-07-05: 16:00:00 30 mL via ORAL

## 2019-07-05 MED ORDER — PANTOPRAZOLE SODIUM 20 MG PO TBEC: 20.0000 mg | DELAYED_RELEASE_TABLET | Freq: Every day | ORAL | 0 refills | Status: DC

## 2019-07-05 MED ORDER — ALUM & MAG HYDROXIDE-SIMETH 200-200-20 MG/5ML PO SUSP: 30.0000 mL | Freq: Once | ORAL | Status: AC

## 2019-07-05 NOTE — ED Provider Notes (Signed)
HPI  SUBJECTIVE:  Deborah Mclean is a 48 y.o. female who presents with 9 days constant burning upper abdominal pain which "comes up into my throat".  She reports a sore throat in the morning.  She had 3 loose watery, nonbloody bowel movements this morning.  She states that this feels similar to previous GERD symptoms but has never had abdominal pain with her GERD before.  She reports right low back pain starting today.  No nausea, vomiting, fevers, abdominal distention, anorexia.  No pelvic pain.  No chest pain, coughing, wheezing, shortness of breath.  No belching, water brash.  No urinary or vaginal complaints.  No antipyretic in the past 4 to 6 hours.  She tried not eating late at night.  Symptoms are better with fasting, but states that her abdominal pain does not completely resolve with fasting.  She states that lying in a curled up position seems to help.  Symptoms are worse with eating, lying down flat.  Is not associated with walking or aggravated with standing up straight.  She states that the car ride over here was not painful.  She has a past medical history of GERD, UTI, nephrolithiasis, status post appendectomy, C-section, abdominal surgery with removal of a mobile intestinal mass.  No history of pancreatitis, alcohol use, NSAID use, pyelonephritis, peptic ulcer disease, atrial fibrillation, mesenteric ischemia, coronary artery disease, MI, gallbladder disease, hypercoagulability, OCP use.  Family history negative for early MI.  Father with gallbladder disease.      Past Medical History:  Diagnosis Date  . GERD (gastroesophageal reflux disease)   . History of kidney stones    2006, 2012  . PONV (postoperative nausea and vomiting)     Past Surgical History:  Procedure Laterality Date  . ABDOMINAL SURGERY  2010  . APPENDECTOMY  1997  . BREAST BIOPSY Right 10/16/2016   FIBROEPITHELIAL LESION WITH PROMINENT INTRACANALICULAR GROWTH PATTERN  . 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 /Procedure: BREAST LUMPECTOMY;  Surgeon: Christene Lye, MD;  Location: [facility] ORS;  Service: General;  Laterality: Right;  . CESAREAN SECTION  2009, 2012    Family History  Problem Relation Age of Onset  . Hyperlipidemia Mother   . Hypertension Father   . Hyperlipidemia Father   . Breast cancer Neg Hx     Social History   Tobacco Use  . Smoking status: Never Smoker  . Smokeless tobacco: Never Used  Substance Use Topics  . Alcohol use: No  . Drug use: No    No current facility-administered medications for this encounter.   Current Outpatient Medications:  .  cetirizine (ZYRTEC) 10 MG tablet, Take 1 tablet (10 mg total) by mouth daily., Disp: 30 tablet, Rfl: 0 .  levonorgestrel (MIRENA) 20 MCG/24HR IUD, 1 each by Intrauterine route once., Disp: , Rfl:  .  Multiple Vitamin (MULTIVITAMIN) capsule, Take 1 capsule by mouth daily., Disp: , Rfl:  .  Vitamin D, Ergocalciferol, (DRISDOL) 50000 units CAPS capsule, Take 50,000 Units by mouth every 7 (seven) days., Disp: , Rfl:  .  famotidine (PEPCID) 20 MG tablet, Take 1 tablet (20 mg total) by mouth 2 (two) times daily., Disp: 40 tablet, Rfl: 0 .  pantoprazole (PROTONIX) 20 MG tablet, Take 1 tablet (20 mg total) by mouth daily., Disp: 30 tablet, Rfl: 0 .  SIMETHICONE PO, Take by mouth., Disp: , Rfl:   Allergies  Allergen Reactions  . Amoxicillin-Pot Clavulanate Anaphylaxis    Has patient had a  PCN reaction causing immediate rash, facial/tongue/throat swelling, SOB or lightheadedness with hypotension: Yes Has patient had a PCN reaction causing severe rash involving mucus membranes or skin necrosis: No Has patient had a PCN reaction that required hospitalization No Has patient had a PCN reaction occurring within the last 10 years: Yes If all of the above answers are "NO", then may proceed with Cephalosporin use.      ROS  As noted in HPI.   Physical Exam  BP (!) 130/94 (BP Location: Right  Arm)   Pulse 62   Temp 97.7 F (36.5 C) (Oral)   Resp 16   Ht 5\' 6"  (1.676 m)   Wt 76.2 kg   SpO2 100%   BMI 27.12 kg/m   Constitutional: Well developed, well nourished, no acute distress. Moving around comfortably Eyes:  EOMI, conjunctiva normal bilaterally HENT: Normocephalic, atraumatic,mucus membranes moist Respiratory: Normal inspiratory effort Cardiovascular: Normal rate, regular rhythm no murmurs rubs or gallops GI: Positive surgical scar upper middle abdomen, appendectomy scar.  Positive periumbilical tenderness, no guarding, rebound.  Negative Murphy.  No flank, suprapubic tenderness. No Lower quadrant tenderness.  Active bowel sounds.  Negative tap table test.  Nondistended. Back: No CVAT.  Positive right paralumbar tenderness.  No L-spine tenderness. Skin: No rash, skin intact Musculoskeletal: no deformities Neurologic: Alert & oriented x 3, no focal neuro deficits Psychiatric: Speech and behavior appropriate   ED Course   Medications  alum & mag hydroxide-simeth (MAALOX/MYLANTA) 200-200-20 MG/5ML suspension 30 mL (0 mLs Oral Duplicate [id number])  alum & mag hydroxide-simeth (MAALOX/MYLANTA) 200-200-20 MG/5ML suspension 30 mL (30 mLs Oral Given 07/05/19 1557)    And  lidocaine (XYLOCAINE) 2 % viscous mouth solution 15 mL (15 mLs Oral Given 07/05/19 1557)    Orders Placed This Encounter  Procedures  . Urinalysis, Complete w Microscopic    Standing Status:   Standing    Number of Occurrences:   1  . CBC with Differential    Standing Status:   Standing    Number of Occurrences:   1  . Comprehensive metabolic panel    Standing Status:   Standing    Number of Occurrences:   1  . Lipase, blood    Standing Status:   Standing    Number of Occurrences:   1  . ED EKG    Epigastric pain    Standing Status:   Standing    Number of Occurrences:   1    Order Specific Question:   Reason for Exam    Answer:   Other (See Comments)  . EKG 12-Lead    Standing Status:    Standing    Number of Occurrences:   1    Results for orders placed or performed during the hospital encounter of 07/05/19 (from the past 24 hour(s))  Urinalysis, Complete w Microscopic     Status: Abnormal   Collection Time: 07/05/19  3:06 PM  Result Value Ref Range   Color, Urine YELLOW YELLOW   APPearance CLEAR CLEAR   Specific Gravity, Urine 1.020 1.005 - 1.030   pH 7.0 5.0 - 8.0   Glucose, UA NEGATIVE NEGATIVE mg/dL   Hgb urine dipstick NEGATIVE NEGATIVE   Bilirubin Urine NEGATIVE NEGATIVE   Ketones, ur NEGATIVE NEGATIVE mg/dL   Protein, ur NEGATIVE NEGATIVE mg/dL   Nitrite NEGATIVE NEGATIVE   Leukocytes,Ua NEGATIVE NEGATIVE   Squamous Epithelial / LPF 0-5 0 - 5   WBC, UA 0-5 0 - 5 WBC/hpf  RBC / HPF 0-5 0 - 5 RBC/hpf   Bacteria, UA RARE (A) NONE SEEN  CBC with Differential     Status: None   Collection Time: 07/05/19  3:58 PM  Result Value Ref Range   WBC 7.6 4.0 - 10.5 K/uL   RBC 4.89 3.87 - 5.11 MIL/uL   Hemoglobin 14.3 12.0 - 15.0 g/dL   HCT 41.4 36.0 - 46.0 %   MCV 84.7 80.0 - 100.0 fL   MCH 29.2 26.0 - 34.0 pg   MCHC 34.5 30.0 - 36.0 g/dL   RDW 13.2 11.5 - 15.5 %   Platelets 318 150 - 400 K/uL   nRBC 0.0 0.0 - 0.2 %   Neutrophils Relative % 65 %   Neutro Abs 5.0 1.7 - 7.7 K/uL   Lymphocytes Relative 22 %   Lymphs Abs 1.6 0.7 - 4.0 K/uL   Monocytes Relative 6 %   Monocytes Absolute 0.4 0.1 - 1.0 K/uL   Eosinophils Relative 6 %   Eosinophils Absolute 0.4 0.0 - 0.5 K/uL   Basophils Relative 1 %   Basophils Absolute 0.1 0.0 - 0.1 K/uL   Immature Granulocytes 0 %   Abs Immature Granulocytes 0.02 0.00 - 0.07 K/uL  Comprehensive metabolic panel     Status: None   Collection Time: 07/05/19  3:58 PM  Result Value Ref Range   Sodium 137 135 - 145 mmol/L   Potassium 3.6 3.5 - 5.1 mmol/L   Chloride 106 98 - 111 mmol/L   CO2 23 22 - 32 mmol/L   Glucose, Bld 86 70 - 99 mg/dL   BUN 11 6 - 20 mg/dL   Creatinine, Ser 0.53 0.44 - 1.00 mg/dL   Calcium 9.0 8.9 - 10.3  mg/dL   Total Protein 6.9 6.5 - 8.1 g/dL   Albumin 3.9 3.5 - 5.0 g/dL   AST 17 15 - 41 U/L   ALT 19 0 - 44 U/L   Alkaline Phosphatase 55 38 - 126 U/L   Total Bilirubin 0.5 0.3 - 1.2 mg/dL   GFR calc non Af Amer >60 >60 mL/min   GFR calc Af Amer >60 >60 mL/min   Anion gap 8 5 - 15  Lipase, blood     Status: None   Collection Time: 07/05/19  3:58 PM  Result Value Ref Range   Lipase 24 11 - 51 U/L   No results found.  ED Clinical Impression  1. Pain of upper abdomen   2. Acute gastritis, presence of bleeding unspecified, unspecified gastritis type      ED Assessment/Plan  Will check EKG, CBC, CMP, lipase.  Her urine has rare bacteria but is otherwise normal.  Will give GI cocktail.  She declined other pain medication.  She appears relatively comfortable at this point in time.   EKG: Sinus bradycardia, rate 59.  Normal axis, normal intervals.  No hypertrophy.  No ST-T wave changes.  No previous EKG for comparison.  Labs reviewed.  Normal CBC, CMP, lipase.  Pt abd exam is benign, no peritoneal signs. No evidence of surgical abd. Suspect peptic ulcer disease versus gastritis. her heart is regular she has no risk factors for mesenteric ischemia such as atrial fibrillation, hypercoagulability, OCP use, however given reported pain with eating must consider this in the differential.  Perforation in the differential, but low.  Doubt obstruction.   Doubt hepatitis, cholecystitis, pancreatitis.. No evidence to support or suggest GYN pathology such as ovarian torsion or infection.  On reexamination, patient  states she feels much, much better after the GI cocktail.  Labs are normal.  Home with Pepcid and Protonix.  She will need to follow-up with her primary care physician and/or GI if this persists.  She will go to the ER if she gets worse in any way.  Discussed labs, MDM, treatment plan, and plan for follow-up with patient. Discussed sn/sx that should prompt return to the ED. patient agrees  with plan.   Meds ordered this encounter  Medications  . alum & mag hydroxide-simeth (MAALOX/MYLANTA) 200-200-20 MG/5ML suspension 30 mL  . AND Linked Order Group   . alum & mag hydroxide-simeth (MAALOX/MYLANTA) 200-200-20 MG/5ML suspension 30 mL   . lidocaine (XYLOCAINE) 2 % viscous mouth solution 15 mL  . pantoprazole (PROTONIX) 20 MG tablet    Sig: Take 1 tablet (20 mg total) by mouth daily.    Dispense:  30 tablet    Refill:  0  . famotidine (PEPCID) 20 MG tablet    Sig: Take 1 tablet (20 mg total) by mouth 2 (two) times daily.    Dispense:  40 tablet    Refill:  0    *This clinic note was created using [employer]. Therefore, there may be occasional mistakes despite careful proofreading.   ?    Melynda Ripple, MD 07/05/19 1740

## 2019-07-05 NOTE — ED Triage Notes (Signed)
Patient c/o mid abdominal pain that started Friday night.  Patient c/o right sided lower back pain that started this morning. Patient reports some diarrhea this morning.  Patient denies N/V.  Patient denies fevers.

## 2019-07-05 NOTE — Discharge Instructions (Addendum)
Try the Pepcid and Protonix.  Bland foods for the next several days, follow-up with Dr. Alice Reichert, gastroneurology, or with your doctor if you are not getting better in several days, go immediately to the ER if you get worse, fevers, pain not controlled with 1 g of Tylenol.  You may take the Tylenol 3 or 4 times a day as needed for pain.

## 2019-09-17 ENCOUNTER — Other Ambulatory Visit: Payer: Self-pay

## 2019-09-17 ENCOUNTER — Ambulatory Visit
Admission: EM | Admit: 2019-09-17 | Discharge: 2019-09-17 | Disposition: A | Discharge status: Home / Self Care | Payer: BLUE CROSS/BLUE SHIELD | Attending: Family Medicine | Admitting: Family Medicine

## 2019-09-17 ENCOUNTER — Ambulatory Visit
Admission: RE | Admit: 2019-09-17 | Discharge: 2019-09-17 | Disposition: A | Discharge status: Home / Self Care | Payer: BLUE CROSS/BLUE SHIELD | Source: Ambulatory Visit | Attending: Family Medicine | Admitting: Family Medicine

## 2019-09-17 DIAGNOSIS — M7989 Other specified soft tissue disorders: Secondary | ICD-10-CM | POA: Insufficient documentation

## 2019-09-17 DIAGNOSIS — M79601 Pain in right arm: Secondary | ICD-10-CM | POA: Insufficient documentation

## 2019-09-17 DIAGNOSIS — R2231 Localized swelling, mass and lump, right upper limb: Secondary | ICD-10-CM | POA: Diagnosis not present

## 2019-09-17 NOTE — ED Triage Notes (Addendum)
Pt states she went to give blood. Pt states the blood was coming out super slow into the tube. Pt states she felt pain in her hand and wrist while giving blood. ( right arm )

## 2019-09-17 NOTE — Discharge Instructions (Signed)
We will call with the results.  Take care  Dr. Nico Rogness  

## 2019-09-18 ENCOUNTER — Telehealth: Payer: Self-pay | Admitting: Emergency Medicine

## 2019-09-18 NOTE — ED Provider Notes (Signed)
[facility]    CSN: [id number] Arrival date & time: 09/17/19  1654      History   Chief Complaint Chief Complaint  Patient presents with   Arm Pain   HPI  48 year old female presents with the above complaint.  Patient reports that 2 weeks ago she gave blood.  In doing so, she felt some numbness/tingling in her hand and wrist.  Since that time she has had ongoing swelling in the distal forearm as well as the wrist.  She reports associated pain.  6/10 in severity.  No medications or interventions tried.  No relieving factors.  She believes that this was brought on by her blood draw.  No reports of redness, or warmth.  No fever.  No other associated symptoms.  No other complaints.  PMH, Surgical Hx, Family Hx, Social History reviewed and updated as below.  Past Medical History:  Diagnosis Date   GERD (gastroesophageal reflux disease)    History of kidney stones    2006, 2012   PONV (postoperative nausea and vomiting)    Past Surgical History:  Procedure Laterality Date   ABDOMINAL SURGERY  2010   APPENDECTOMY  1997   BREAST BIOPSY Right 10/16/2016   FIBROEPITHELIAL LESION WITH PROMINENT INTRACANALICULAR GROWTH PATTERN   BREAST LUMPECTOMY Right 11/01/2016   BENIGN PHYLLODES TUMOR, 1.1 CM. /Procedure: BREAST LUMPECTOMY;  Surgeon: Christene Lye, MD;  Location: [facility] OR[facility];  Service: General;  Laterality: Right;   CESAREAN SECTION  2009, 2012    OB History    Gravida  4   Para  2   Term      Preterm      AB  2   Living        SAB  2   TAB      Ectopic      Multiple      Live Births           Obstetric Comments  1st Menstrual Cycle:  11  1st Pregnancy:  36          Home Medications    Prior to Admission medications   Medication Sig Start Date End Date Taking? Authorizing Provider  cetirizine (ZYRTEC) 10 MG tablet Take 1 tablet (10 mg total) by mouth daily. 08/19/17   Luvenia Redden, PA-C  famotidine (PEPCID) 20 MG  tablet Take 1 tablet (20 mg total) by mouth 2 (two) times daily. 07/05/19   Melynda Ripple, MD  levonorgestrel (MIRENA) 20 MCG/24HR IUD 1 each by Intrauterine route once.    [provider]  Multiple Vitamin (MULTIVITAMIN) capsule Take 1 capsule by mouth daily.    [provider]  pantoprazole (PROTONIX) 20 MG tablet Take 1 tablet (20 mg total) by mouth daily. 07/05/19   Melynda Ripple, MD  SIMETHICONE PO Take by mouth.    [provider]  Vitamin D, Ergocalciferol, (DRISDOL) 50000 units CAPS capsule Take 50,000 Units by mouth every 7 (seven) days.    [provider]    Family History Family History  Problem Relation Age of Onset   Hyperlipidemia Mother    Hypertension Father    Hyperlipidemia Father    Breast cancer Neg Hx     Social History Social History   Tobacco Use   Smoking status: Never Smoker   Smokeless tobacco: Never Used  Substance Use Topics   Alcohol use: No   Drug use: No     Allergies   Amoxicillin-pot clavulanate   Review  of Systems Review of Systems  Constitutional: Negative.   Musculoskeletal:       Swelling & pain (right forearm/wrist).   Physical Exam Triage Vital Signs ED Triage Vitals  Enc Vitals Group     BP 09/17/19 1714 129/88     Pulse Rate 09/17/19 1714 77     Resp 09/17/19 1714 18     Temp 09/17/19 1714 98 F (36.7 C)     Temp Source 09/17/19 1714 Oral     SpO2 09/17/19 1714 98 %     Weight 09/17/19 1717 171 lb (77.6 kg)     Height --      Head Circumference --      Peak Flow --      Pain Score 09/17/19 1717 6     Pain Loc --      Pain Edu? --      Excl. in [place]? --    Updated Vital Signs BP 129/88 (BP Location: Right Arm)    Pulse 77    Temp 98 F (36.7 C) (Oral)    Resp 18    Wt 77.6 kg    SpO2 98%    BMI 27.60 kg/m   Visual Acuity Right Eye Distance:   Left Eye Distance:   Bilateral Distance:    Right Eye Near:   Left Eye Near:    Bilateral Near:     Physical  Exam Vitals signs and nursing note reviewed.  Constitutional:      General: She is not in acute distress.    Appearance: Normal appearance. She is not ill-appearing.  HENT:     Head: Normocephalic and atraumatic.  Eyes:     General:        Right eye: No discharge.        Left eye: No discharge.     Conjunctiva/sclera: Conjunctivae normal.  Cardiovascular:     Rate and Rhythm: Normal rate and regular rhythm.     Heart sounds: Murmur present.  Pulmonary:     Effort: Pulmonary effort is normal.     Breath sounds: Normal breath sounds. No wheezing, rhonchi or rales.  Musculoskeletal:     Comments: Right upper extremity - mild swelling of the volar forearm and wrist. No tenderness on exam.  Neurological:     Mental Status: She is alert.    UC Treatments / Results  Labs (all labs ordered are listed, but only abnormal results are displayed) Labs Reviewed - No data to display  EKG   Radiology US Venous Img Upper Uni Right(dvt)  Result Date: 09/17/2019 CLINICAL DATA:  Right upper extremity swelling and pain. EXAM: RIGHT UPPER EXTREMITY VENOUS DOPPLER ULTRASOUND TECHNIQUE: Gray-scale sonography with graded compression, as well as color Doppler and duplex ultrasound were performed to evaluate the upper extremity deep venous system from the level of the subclavian vein and including the jugular, axillary, basilic, radial, ulnar and upper cephalic vein. Spectral Doppler was utilized to evaluate flow at rest and with distal augmentation maneuvers. COMPARISON:  None. FINDINGS: Contralateral Subclavian Vein: Respiratory phasicity is normal and symmetric with the symptomatic side. No evidence of thrombus. Normal compressibility. Internal Jugular Vein: No evidence of thrombus. Normal compressibility, respiratory phasicity and response to augmentation. Subclavian Vein: No evidence of thrombus. Normal compressibility, respiratory phasicity and response to augmentation. Axillary Vein: No evidence of  thrombus. Normal compressibility, respiratory phasicity and response to augmentation. Cephalic Vein: No evidence of thrombus. Normal compressibility, respiratory phasicity and response to augmentation. Basilic Vein: No  evidence of thrombus. Normal compressibility, respiratory phasicity and response to augmentation. Brachial Veins: No evidence of thrombus. Normal compressibility, respiratory phasicity and response to augmentation. Radial Veins: No evidence of thrombus. Normal compressibility, respiratory phasicity and response to augmentation. Ulnar Veins: No evidence of thrombus. Normal compressibility, respiratory phasicity and response to augmentation. Venous Reflux:  None visualized. Other Findings:  None visualized. IMPRESSION: No evidence of DVT within the right upper extremity. Electronically Signed   By: Dorise Bullion III M.D   On: 09/17/2019 19:35    Procedures Procedures (including critical care time)  Medications Ordered in UC Medications - No data to display  Initial Impression / Assessment and Plan / UC Course  I have reviewed the triage vital signs and the nursing notes.  Pertinent labs & imaging results that were available during my care of the patient were reviewed by me and considered in my medical decision making (see chart for details).    48 year old female presents with swelling of her right upper extremity after giving blood 2 weeks ago.  Ultrasound obtained and revealed no abnormalities.  No evidence of DVT.  Supportive care.  Final Clinical Impressions(s) / UC Diagnoses   Final diagnoses:  Localized swelling of right upper extremity     Discharge Instructions     We will call with the results.  Take care  Dr. Lacinda Axon    ED Prescriptions    None     PDMP not reviewed this encounter.   Thersa Salt [place], [place] 09/18/19 [phone]

## 2019-09-18 NOTE — Telephone Encounter (Signed)
Patient called wanting her [place] result.  Patient notified that her [place] was Negative for DVT.  Patient verbalized understanding.

## 2020-08-19 ENCOUNTER — Ambulatory Visit (INDEPENDENT_AMBULATORY_CARE_PROVIDER_SITE_OTHER): Payer: BLUE CROSS/BLUE SHIELD

## 2020-08-19 ENCOUNTER — Other Ambulatory Visit: Payer: Self-pay

## 2020-08-19 ENCOUNTER — Ambulatory Visit
Admission: RE | Admit: 2020-08-19 | Discharge: 2020-08-19 | Disposition: A | Discharge status: Home / Self Care | Payer: BLUE CROSS/BLUE SHIELD | Source: Ambulatory Visit | Attending: Physician Assistant | Admitting: Physician Assistant

## 2020-08-19 VITALS — BP 156/88 | HR 59 | Temp 98.0°F | Resp 14 | Ht 66.0 in | Wt 165.0 lb

## 2020-08-19 DIAGNOSIS — R1033 Periumbilical pain: Secondary | ICD-10-CM

## 2020-08-19 DIAGNOSIS — R1110 Vomiting, unspecified: Secondary | ICD-10-CM | POA: Diagnosis not present

## 2020-08-19 DIAGNOSIS — R748 Abnormal levels of other serum enzymes: Secondary | ICD-10-CM | POA: Diagnosis not present

## 2020-08-19 DIAGNOSIS — R112 Nausea with vomiting, unspecified: Secondary | ICD-10-CM

## 2020-08-19 LAB — URINALYSIS, COMPLETE (UACMP) WITH MICROSCOPIC
Bacteria, UA: NONE SEEN
Glucose, UA: NEGATIVE mg/dL
Ketones, ur: NEGATIVE mg/dL
Leukocytes,Ua: NEGATIVE
Nitrite: NEGATIVE
Protein, ur: NEGATIVE mg/dL
Specific Gravity, Urine: 1.02 (ref 1.005–1.030)
WBC, UA: NONE SEEN WBC/hpf (ref 0–5)
pH: 7 (ref 5.0–8.0)

## 2020-08-19 LAB — CBC WITH DIFFERENTIAL/PLATELET
Abs Immature Granulocytes: 0.03 10*3/uL (ref 0.00–0.07)
Basophils Absolute: 0.1 10*3/uL (ref 0.0–0.1)
Basophils Relative: 1 %
Eosinophils Absolute: 0.2 10*3/uL (ref 0.0–0.5)
Eosinophils Relative: 2 %
HCT: 47.4 % — ABNORMAL HIGH (ref 36.0–46.0)
Hemoglobin: 16.3 g/dL — ABNORMAL HIGH (ref 12.0–15.0)
Immature Granulocytes: 0 %
Lymphocytes Relative: 14 %
Lymphs Abs: 1 10*3/uL (ref 0.7–4.0)
MCH: 29.6 pg (ref 26.0–34.0)
MCHC: 34.4 g/dL (ref 30.0–36.0)
MCV: 86 fL (ref 80.0–100.0)
Monocytes Absolute: 0.4 10*3/uL (ref 0.1–1.0)
Monocytes Relative: 6 %
Neutro Abs: 5.3 10*3/uL (ref 1.7–7.7)
Neutrophils Relative %: 77 %
Platelets: 403 10*3/uL — ABNORMAL HIGH (ref 150–400)
RBC: 5.51 MIL/uL — ABNORMAL HIGH (ref 3.87–5.11)
RDW: 12.4 % (ref 11.5–15.5)
WBC: 6.9 10*3/uL (ref 4.0–10.5)
nRBC: 0 % (ref 0.0–0.2)

## 2020-08-19 LAB — COMPREHENSIVE METABOLIC PANEL
ALT: 652 U/L — ABNORMAL HIGH (ref 0–44)
AST: 623 U/L — ABNORMAL HIGH (ref 15–41)
Albumin: 4.7 g/dL (ref 3.5–5.0)
Alkaline Phosphatase: 130 U/L — ABNORMAL HIGH (ref 38–126)
Anion gap: 6 (ref 5–15)
BUN: 11 mg/dL (ref 6–20)
CO2: 26 mmol/L (ref 22–32)
Calcium: 9.6 mg/dL (ref 8.9–10.3)
Chloride: 105 mmol/L (ref 98–111)
Creatinine, Ser: 0.63 mg/dL (ref 0.44–1.00)
GFR, Estimated: >60 mL/min (ref >60)
Glucose, Bld: 103 mg/dL — ABNORMAL HIGH (ref 70–99)
Potassium: 4 mmol/L (ref 3.5–5.1)
Sodium: 137 mmol/L (ref 135–145)
Total Bilirubin: 1.5 mg/dL — ABNORMAL HIGH (ref 0.3–1.2)
Total Protein: 8.1 g/dL (ref 6.5–8.1)

## 2020-08-19 LAB — LIPASE, BLOOD: Lipase: 26 U/L (ref 11–51)

## 2020-08-19 MED ORDER — ONDANSETRON 4 MG PO TBDP
4.0000 mg | ORAL_TABLET | Freq: Once | ORAL | Status: AC
  Administered 2020-08-19: 4 mg via ORAL

## 2020-08-19 NOTE — ED Triage Notes (Signed)
Patient c/o severe episodes of abdominal pain around her belly button off and on for 6 weeks.  Patient states last night it was worse with vomiting and pain lasting 5 hours.

## 2020-08-19 NOTE — ED Provider Notes (Signed)
[facility]    CSN: [id number] Arrival date & time: 08/19/20  1212      History   Chief Complaint Chief Complaint  Patient presents with   Abdominal Pain   Emesis    HPI Deborah Mclean is a 49 y.o. female   presenting for 6-week history of intermittent umbilical abdominal pain.  She says that she has an episode about once a week that can last for a few hours.  She said the last episode was last night and it lasted for about 5 hours.  Says she started to have really bad umbilical abdominal pain that seemed to radiate up the abdomen the part of her chest.  This is associated with nausea and vomiting.  She also says her appetite is decreased.  Admits to yellowish stools yesterday.  States urine looks normal.  States she is having normal bowel movements.  Denies any associated fever.  Admits to feeling fatigued.  She says nothing seems to make the pain better or worse and it just seems to happen randomly.  Denies any chest pain or breathing difficulty.  Denies any dysuria or vaginal discharge.  No back pain.  Patient has a past medical history significant for acid reflux but says this feels different.  She states she took an acid reducer without relief at home yesterday.  She says she also took Tylenol.  She does not have any other concerns today.  HPI  Past Medical History:  Diagnosis Date   GERD (gastroesophageal reflux disease)    History of kidney stones    2006, 2012   PONV (postoperative nausea and vomiting)     There are no problems to display for this patient.   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 /Procedure: BREAST LUMPECTOMY;  Surgeon: Christene Lye, MD;  Location: [facility] ORS;  Service: General;   Laterality: Right;   CESAREAN SECTION  2009, 2012    OB History    Gravida  4   Para  2   Term      Preterm      AB  2   Living        SAB  2   TAB      Ectopic      Multiple      Live Births           Obstetric Comments  1st Menstrual Cycle:  11  1st Pregnancy:  36          Home Medications    Prior to Admission medications   Medication Sig Start Date End Date Taking? Authorizing Provider  famotidine (PEPCID) 20 MG tablet Take 1 tablet (20 mg total) by mouth 2 (two) times daily. 07/05/19  Yes Melynda Ripple, MD  levonorgestrel (MIRENA) 20 MCG/24HR IUD 1 each by Intrauterine route once.   Yes [provider]  Multiple Vitamin (MULTIVITAMIN) capsule Take 1 capsule by mouth daily.   Yes [provider]  pantoprazole (PROTONIX) 20 MG tablet Take 1 tablet (20 mg total) by mouth daily. 07/05/19  Yes Melynda Ripple, MD  cetirizine (ZYRTEC) 10 MG tablet Take 1 tablet (10 mg total) by mouth daily. 08/19/17   Luvenia Redden, PA-C  SIMETHICONE PO Take by mouth.  [provider]  Vitamin D, Ergocalciferol, (DRISDOL) 50000 units CAPS capsule Take 50,000 Units by mouth every 7 (seven) days.    [provider]    Family History Family History  Problem Relation Age of Onset   Hyperlipidemia Mother    Hypertension Father    Hyperlipidemia Father    Breast cancer Neg Hx     Social History Social History   Tobacco Use   Smoking status: Never Smoker   Smokeless tobacco: Never Used  Scientific laboratory technician Use: Never used  Substance Use Topics   Alcohol use: No   Drug use: No     Allergies   Amoxicillin-pot clavulanate   Review of Systems Review of Systems  Constitutional: Positive for appetite change and fatigue. Negative for chills, diaphoresis and fever.  HENT: Negative for congestion, ear pain, rhinorrhea, sinus pressure, sinus pain and sore throat.   Respiratory: Negative for cough and shortness of  breath.   Gastrointestinal: Positive for abdominal pain, nausea and vomiting. Negative for blood in stool and diarrhea.  Genitourinary: Negative for dysuria and frequency.  Musculoskeletal: Negative for arthralgias and myalgias.  Skin: Negative for rash.  Neurological: Positive for headaches. Negative for weakness.  Hematological: Negative for adenopathy.     Physical Exam Triage Vital Signs ED Triage Vitals  Enc Vitals Group     BP 08/19/20 1237 (!) 156/88     Pulse Rate 08/19/20 1237 (!) 59     Resp 08/19/20 1237 14     Temp 08/19/20 1237 98 F (36.7 C)     Temp Source 08/19/20 1237 Oral     SpO2 08/19/20 1237 98 %     Weight 08/19/20 1235 165 lb (74.8 kg)     Height 08/19/20 1235 '5\' 6"'  (1.676 m)     Head Circumference --      Peak Flow --      Pain Score 08/19/20 1235 0     Pain Loc --      Pain Edu? --      Excl. in [place]? --    No data found.  Updated Vital Signs BP (!) 156/88 (BP Location: Left Arm)    Pulse (!) 59    Temp 98 F (36.7 C) (Oral)    Resp 14    Ht '5\' 6"'  (1.676 m)    Wt 165 lb (74.8 kg)    SpO2 98%    BMI 26.63 kg/m       Physical Exam Vitals and nursing note reviewed.  Constitutional:      General: She is not in acute distress.    Appearance: Normal appearance. She is not ill-appearing or toxic-appearing.  HENT:     Head: Normocephalic and atraumatic.     Nose: Nose normal.     Mouth/Throat:     Mouth: Mucous membranes are moist.     Pharynx: Oropharynx is clear.  Eyes:     General: No scleral icterus.       Right eye: No discharge.        Left eye: No discharge.     Conjunctiva/sclera: Conjunctivae normal.  Cardiovascular:     Rate and Rhythm: Regular rhythm. Bradycardia present.     Heart sounds: Normal heart sounds.  Pulmonary:     Effort: Pulmonary effort is normal. No respiratory distress.     Breath sounds: Normal breath sounds.  Abdominal:     General: Bowel sounds are normal.     Palpations: Abdomen is  soft.     Tenderness:  There is abdominal tenderness (periumbilical, epigastric). There is no right CVA tenderness, left CVA tenderness or guarding. Negative signs include Murphy's sign.  Musculoskeletal:     Cervical back: Neck supple.  Skin:    General: Skin is dry.  Neurological:     General: No focal deficit present.     Mental Status: She is alert. Mental status is at baseline.     Motor: No weakness.     Gait: Gait normal.  Psychiatric:        Mood and Affect: Mood normal.        Behavior: Behavior normal.        Thought Content: Thought content normal.      UC Treatments / Results  Labs (all labs ordered are listed, but only abnormal results are displayed) Labs Reviewed  COMPREHENSIVE METABOLIC PANEL - Abnormal; Notable for the following components:      Result Value   Glucose, Bld 103 (*)    AST 623 (*)    ALT 652 (*)    Alkaline Phosphatase 130 (*)    Total Bilirubin 1.5 (*)    All other components within normal limits  CBC WITH DIFFERENTIAL/PLATELET - Abnormal; Notable for the following components:   RBC 5.51 (*)    Hemoglobin 16.3 (*)    HCT 47.4 (*)    Platelets 403 (*)    All other components within normal limits  URINALYSIS, COMPLETE (UACMP) WITH MICROSCOPIC - Abnormal; Notable for the following components:   APPearance HAZY (*)    Hgb urine dipstick TRACE (*)    Bilirubin Urine SMALL (*)    All other components within normal limits  LIPASE, BLOOD    EKG   Radiology DG Abdomen 1 View  Result Date: 08/19/2020 CLINICAL DATA:  Abdominal pain, primarily periumbilical EXAM: ABDOMEN - 1 VIEW COMPARISON:  None. FINDINGS: Moderate stool in the colon. There is no bowel dilatation or air-fluid level to suggest bowel obstruction. No free air. Probable phleboliths in the pelvis. Intrauterine device in mid pelvis. Visualized lung bases are clear. IMPRESSION: No findings indicative of bowel obstruction or free air. Lung bases clear. Intrauterine device in mid pelvis. Electronically Signed    By: Lowella Grip III M.D.   On: 08/19/2020 13:51    Procedures Procedures (including critical care time)  Medications Ordered in UC Medications  ondansetron (ZOFRAN-ODT) disintegrating tablet 4 mg (4 mg Oral Given 08/19/20 1321)    Initial Impression / Assessment and Plan / UC Course  I have reviewed the triage vital signs and the nursing notes.  Pertinent labs & imaging results that were available during my care of the patient were reviewed by me and considered in my medical decision making (see chart for details).   49 year old female presenting for 6-week intermittent history of umbilical abdominal pain with radiation to epigastric region and occasionally into chest, associated with nausea and vomiting and decreased appetite.  On exam she has tenderness of the apparently umbilical region and epigastric area.  Questionable mild right upper quadrant tenderness with negative Murphy sign.  No guarding or rebound.    Labs obtained today including CBC, CMP, lipase, urinalysis.  AST elevated at 623 and ALT elevated at 652.  Alk phos elevated at 130.  Total bili elevated at 1.5.  Lipase within normal limits.  KUB does not show any sign of obstruction and is otherwise normal.  Based on patient's significantly elevated liver enzymes and persistent/intermittent abdominal pain, suspect  likely acute cholecystitis and possible obstruction of gallstone at this time.  Advised her to follow-up in the ED immediately.  Patient plans to take her self to the ED at this time.  She states she will go to [facility]. Patient leaving in stable condition.   Final Clinical Impressions(s) / UC Diagnoses   Final diagnoses:  Periumbilical abdominal pain  Non-intractable vomiting with nausea, unspecified vomiting type  Elevated liver enzymes     Discharge Instructions     You have been advised to follow up immediately in the emergency department for concerning signs.symptoms. If you declined EMS  transport, please have a family member take you directly to the ED at this time. Do not delay. Based on concerns about condition, if you do not follow up in th e ED, you may risk poor outcomes including worsening of condition, delayed treatment and potentially life threatening issues. If you have declined to go to the ED at this time, you should call your PCP immediately to set up a follow up appointment.  Go to ED for red flag symptoms, including; fevers you cannot reduce with Tylenol/Motrin, severe headaches, vision changes, numbness/weakness in part of the body, lethargy, confusion, intractable vomiting, severe dehydration, chest pain, breathing difficulty, severe persistent abdominal or pelvic pain, signs of severe infection (increased redness, swelling of an area), feeling faint or passing out, dizziness, etc. You should especially go to the ED for sudden acute worsening of condition if you do not elect to go at this time.     ED Prescriptions    None     PDMP not reviewed this encounter.   Danton Clap, PA-C 08/19/20 1418

## 2020-08-19 NOTE — Discharge Instructions (Signed)

## 2022-06-21 IMAGING — CR DG ABDOMEN 1V
2 series · 2 of 2 positions shown · non-contrast
Comparison: None.

CLINICAL DATA: Abdominal pain, primarily periumbilical

EXAM:
ABDOMEN - 1 VIEW

[abdomen kub (1 of 2)]
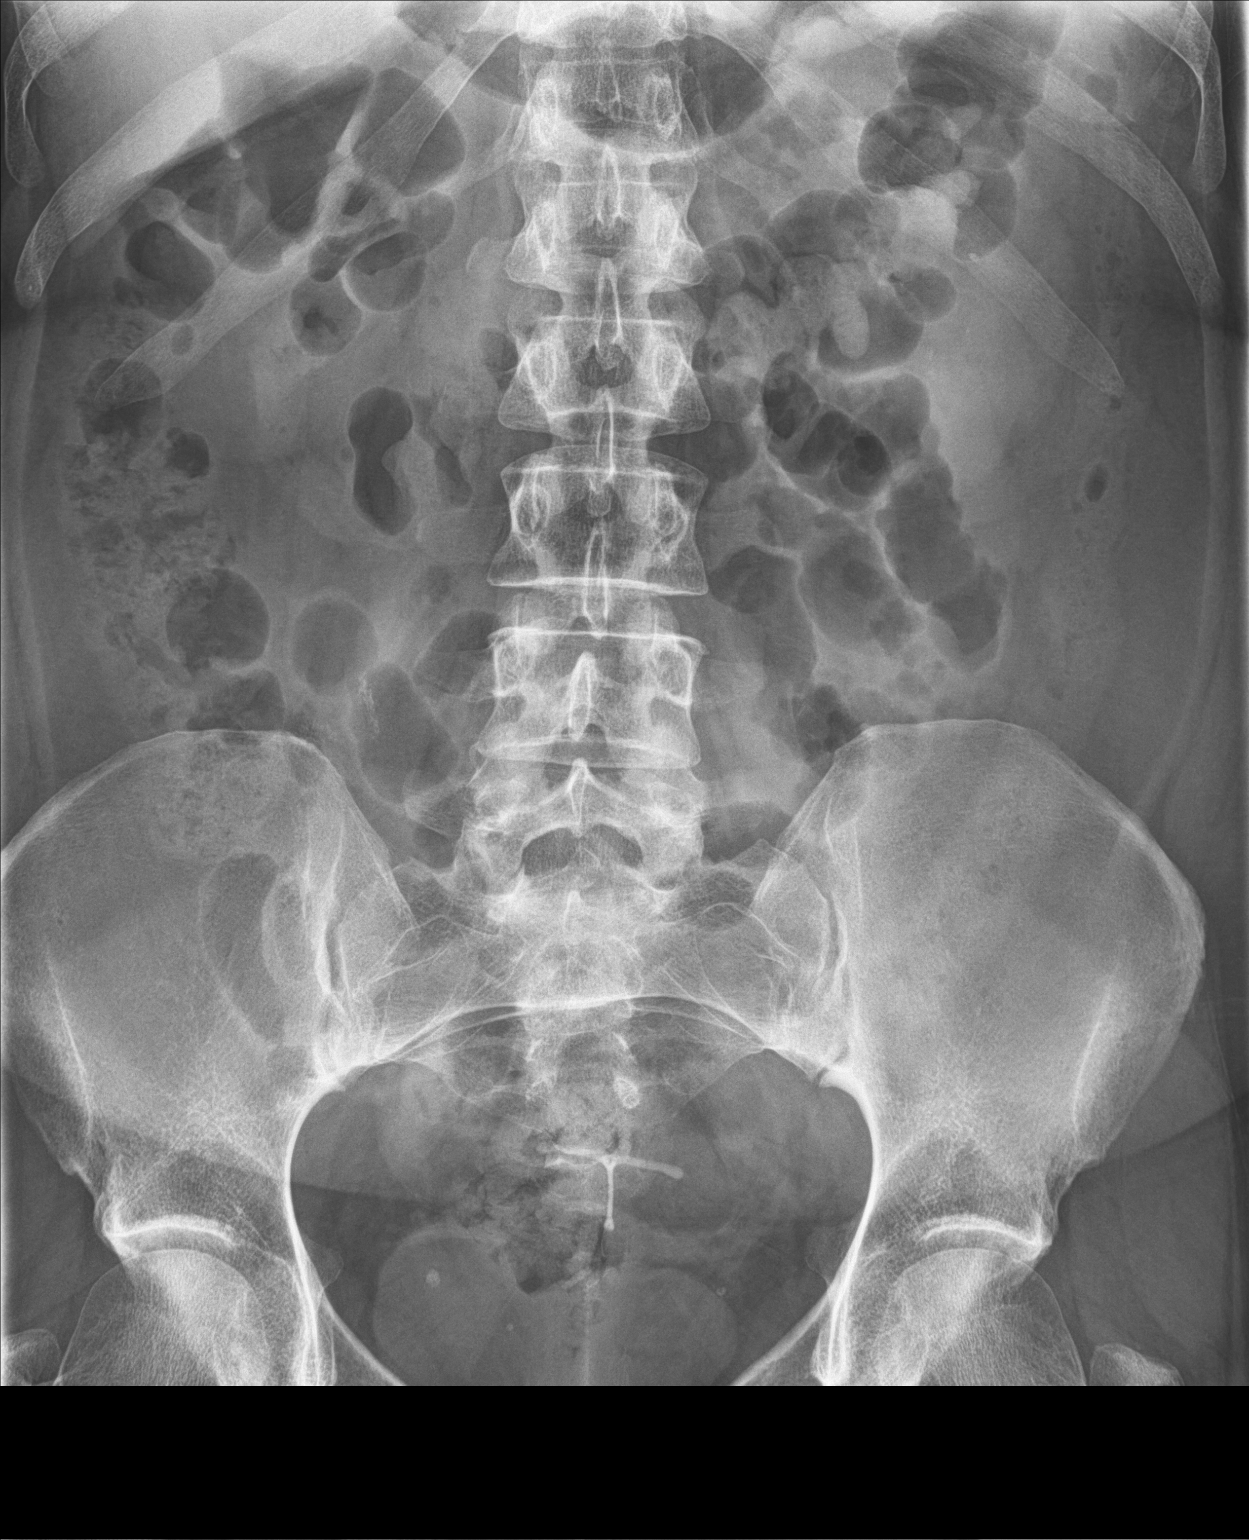

[abdomen kub (2 of 2)]
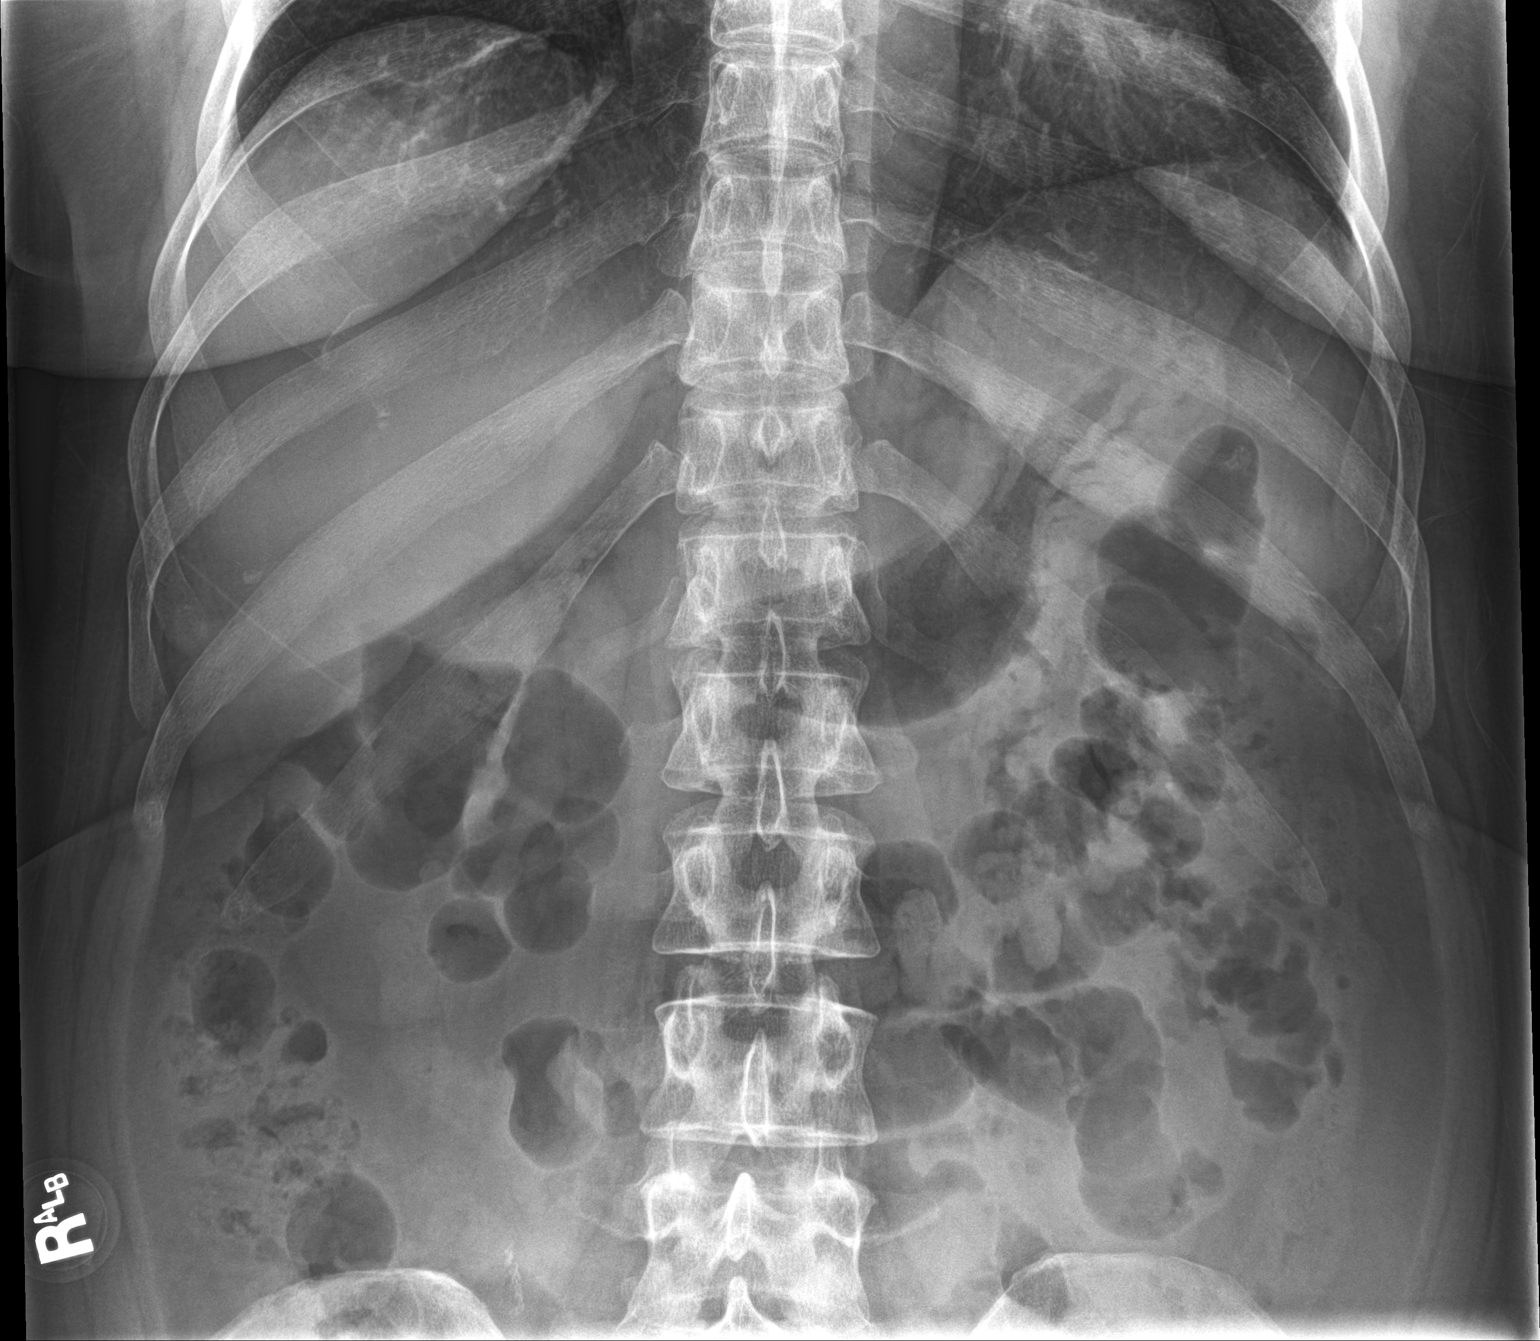

[2 of 2 positions shown; findings below may reference images not displayed]

FINDINGS: Moderate stool in the colon. There is no bowel dilatation or
air-fluid level to suggest bowel obstruction. No free air. Probable
phleboliths in the pelvis. Intrauterine device in mid pelvis.
Visualized lung bases are clear.
IMPRESSION: No findings indicative of bowel obstruction or free air. Lung bases
clear. Intrauterine device in mid pelvis.

## 2023-12-30 ENCOUNTER — Ambulatory Visit
Admission: EM | Admit: 2023-12-30 | Discharge: 2023-12-30 | Payer: BLUE CROSS/BLUE SHIELD | Attending: Emergency Medicine | Admitting: Emergency Medicine

## 2023-12-30 ENCOUNTER — Emergency Department: Payer: BLUE CROSS/BLUE SHIELD

## 2023-12-30 ENCOUNTER — Other Ambulatory Visit: Payer: Self-pay

## 2023-12-30 ENCOUNTER — Inpatient Hospital Stay
Admission: EM | Admit: 2023-12-30 | Discharge: 2024-01-01 | DRG: 854 | Disposition: A | Discharge status: Home / Self Care | Payer: BLUE CROSS/BLUE SHIELD | Attending: Internal Medicine | Admitting: Internal Medicine

## 2023-12-30 DIAGNOSIS — Z880 Allergy status to penicillin: Secondary | ICD-10-CM

## 2023-12-30 DIAGNOSIS — I160 Hypertensive urgency: Secondary | ICD-10-CM

## 2023-12-30 DIAGNOSIS — Z83438 Family history of other disorder of lipoprotein metabolism and other lipidemia: Secondary | ICD-10-CM

## 2023-12-30 DIAGNOSIS — Z8249 Family history of ischemic heart disease and other diseases of the circulatory system: Secondary | ICD-10-CM | POA: Diagnosis not present

## 2023-12-30 DIAGNOSIS — N136 Pyonephrosis: Secondary | ICD-10-CM | POA: Diagnosis present

## 2023-12-30 DIAGNOSIS — R109 Unspecified abdominal pain: Secondary | ICD-10-CM | POA: Diagnosis not present

## 2023-12-30 DIAGNOSIS — R6520 Severe sepsis without septic shock: Secondary | ICD-10-CM | POA: Diagnosis present

## 2023-12-30 DIAGNOSIS — A4150 Gram-negative sepsis, unspecified: Secondary | ICD-10-CM | POA: Diagnosis present

## 2023-12-30 DIAGNOSIS — Z6833 Body mass index (BMI) 33.0-33.9, adult: Secondary | ICD-10-CM | POA: Diagnosis not present

## 2023-12-30 DIAGNOSIS — J302 Other seasonal allergic rhinitis: Secondary | ICD-10-CM | POA: Insufficient documentation

## 2023-12-30 DIAGNOSIS — Z87442 Personal history of urinary calculi: Secondary | ICD-10-CM

## 2023-12-30 DIAGNOSIS — E669 Obesity, unspecified: Secondary | ICD-10-CM | POA: Diagnosis present

## 2023-12-30 DIAGNOSIS — R112 Nausea with vomiting, unspecified: Secondary | ICD-10-CM | POA: Diagnosis not present

## 2023-12-30 DIAGNOSIS — N390 Urinary tract infection, site not specified: Secondary | ICD-10-CM | POA: Diagnosis not present

## 2023-12-30 DIAGNOSIS — R8271 Bacteriuria: Secondary | ICD-10-CM

## 2023-12-30 DIAGNOSIS — N139 Obstructive and reflux uropathy, unspecified: Secondary | ICD-10-CM | POA: Diagnosis present

## 2023-12-30 DIAGNOSIS — N3289 Other specified disorders of bladder: Secondary | ICD-10-CM | POA: Diagnosis present

## 2023-12-30 DIAGNOSIS — R310 Gross hematuria: Secondary | ICD-10-CM | POA: Diagnosis not present

## 2023-12-30 DIAGNOSIS — N200 Calculus of kidney: Principal | ICD-10-CM

## 2023-12-30 DIAGNOSIS — K219 Gastro-esophageal reflux disease without esophagitis: Secondary | ICD-10-CM | POA: Diagnosis present

## 2023-12-30 DIAGNOSIS — N201 Calculus of ureter: Secondary | ICD-10-CM | POA: Diagnosis not present

## 2023-12-30 LAB — COMPREHENSIVE METABOLIC PANEL
ALT: 74 U/L — ABNORMAL HIGH (ref 0–44)
AST: 48 U/L — ABNORMAL HIGH (ref 15–41)
Albumin: 4.3 g/dL (ref 3.5–5.0)
Alkaline Phosphatase: 96 U/L (ref 38–126)
Anion gap: 15 (ref 5–15)
BUN: 16 mg/dL (ref 6–20)
CO2: 16 mmol/L — ABNORMAL LOW (ref 22–32)
Calcium: 9.6 mg/dL (ref 8.9–10.3)
Chloride: 110 mmol/L (ref 98–111)
Creatinine, Ser: 0.88 mg/dL (ref 0.44–1.00)
GFR, Estimated: >60 mL/min (ref >60)
Glucose, Bld: 116 mg/dL — ABNORMAL HIGH (ref 70–99)
Potassium: 3.7 mmol/L (ref 3.5–5.1)
Sodium: 141 mmol/L (ref 135–145)
Total Bilirubin: 0.6 mg/dL (ref 0.0–1.2)
Total Protein: 7.3 g/dL (ref 6.5–8.1)

## 2023-12-30 LAB — URINALYSIS, ROUTINE W REFLEX MICROSCOPIC
Bilirubin Urine: NEGATIVE
Glucose, UA: NEGATIVE mg/dL
Ketones, ur: 5 mg/dL — AB
Nitrite: NEGATIVE
Protein, ur: 100 mg/dL — AB
RBC / HPF: >50 RBC/hpf (ref 0–5)
Specific Gravity, Urine: 1.025 (ref 1.005–1.030)
pH: 7 (ref 5.0–8.0)

## 2023-12-30 LAB — CBC
HCT: 46.7 % — ABNORMAL HIGH (ref 36.0–46.0)
Hemoglobin: 15.7 g/dL — ABNORMAL HIGH (ref 12.0–15.0)
MCH: 30.5 pg (ref 26.0–34.0)
MCHC: 33.6 g/dL (ref 30.0–36.0)
MCV: 90.7 fL (ref 80.0–100.0)
Platelets: 387 10*3/uL (ref 150–400)
RBC: 5.15 MIL/uL — ABNORMAL HIGH (ref 3.87–5.11)
RDW: 12.6 % (ref 11.5–15.5)
WBC: 16.1 10*3/uL — ABNORMAL HIGH (ref 4.0–10.5)
nRBC: 0 % (ref 0.0–0.2)

## 2023-12-30 LAB — LIPASE, BLOOD: Lipase: 25 U/L (ref 11–51)

## 2023-12-30 LAB — LACTIC ACID, PLASMA: Lactic Acid, Venous: 3.2 mmol/L (ref 0.5–1.9)

## 2023-12-30 MED ORDER — LACTATED RINGERS IV SOLN
150.0000 mL/h | INTRAVENOUS | Status: AC
  Administered 2023-12-31 (×2): 150 mL/h via INTRAVENOUS

## 2023-12-30 MED ORDER — MULTIVITAMINS PO CAPS: 1.0000 | ORAL_CAPSULE | Freq: Every day | ORAL | Status: DC

## 2023-12-30 MED ORDER — SIMETHICONE 80 MG PO CHEW: 80.0000 mg | CHEWABLE_TABLET | Freq: Four times a day (QID) | ORAL | Status: DC | PRN

## 2023-12-30 MED ORDER — KETOROLAC TROMETHAMINE 30 MG/ML IJ SOLN
30.0000 mg | Freq: Once | INTRAMUSCULAR | Status: AC
  Administered 2023-12-30: 30 mg via INTRAMUSCULAR

## 2023-12-30 MED ORDER — CIPROFLOXACIN IN D5W 400 MG/200ML IV SOLN
400.0000 mg | Freq: Once | INTRAVENOUS | Status: AC
  Administered 2023-12-30: 400 mg via INTRAVENOUS
  Filled 2023-12-30: qty 200

## 2023-12-30 MED ORDER — ENOXAPARIN SODIUM 60 MG/0.6ML IJ SOSY
0.5000 mg/kg | PREFILLED_SYRINGE | INTRAMUSCULAR | Status: DC
  Administered 2023-12-31 – 2024-01-01 (×2): 47.5 mg via SUBCUTANEOUS
  Filled 2023-12-30 (×2): qty 0.6

## 2023-12-30 MED ORDER — MORPHINE SULFATE (PF) 4 MG/ML IV SOLN
4.0000 mg | Freq: Once | INTRAVENOUS | Status: AC
  Administered 2023-12-30: 4 mg via INTRAVENOUS
  Filled 2023-12-30: qty 1

## 2023-12-30 MED ORDER — TAMSULOSIN HCL 0.4 MG PO CAPS
0.4000 mg | ORAL_CAPSULE | ORAL | Status: AC
  Administered 2023-12-30: 0.4 mg via ORAL
  Filled 2023-12-30: qty 1

## 2023-12-30 MED ORDER — LORATADINE 10 MG PO TABS
10.0000 mg | ORAL_TABLET | Freq: Every day | ORAL | Status: DC
  Administered 2023-12-31 – 2024-01-01 (×2): 10 mg via ORAL
  Filled 2023-12-30 (×2): qty 1

## 2023-12-30 MED ORDER — MAGNESIUM HYDROXIDE 400 MG/5ML PO SUSP: 30.0000 mL | Freq: Every day | ORAL | Status: DC | PRN

## 2023-12-30 MED ORDER — ACETAMINOPHEN 325 MG PO TABS: 975.0000 mg | ORAL_TABLET | Freq: Once | ORAL | Status: DC

## 2023-12-30 MED ORDER — ONDANSETRON HCL 4 MG/2ML IJ SOLN
4.0000 mg | INTRAMUSCULAR | Status: AC
  Administered 2023-12-30: 4 mg via INTRAVENOUS
  Filled 2023-12-30: qty 2

## 2023-12-30 MED ORDER — MORPHINE SULFATE (PF) 4 MG/ML IV SOLN
6.0000 mg | Freq: Once | INTRAVENOUS | Status: AC
  Administered 2023-12-30: 6 mg via INTRAVENOUS
  Filled 2023-12-30: qty 2

## 2023-12-30 MED ORDER — PANTOPRAZOLE SODIUM 20 MG PO TBEC: 20.0000 mg | DELAYED_RELEASE_TABLET | Freq: Every day | ORAL | Status: DC

## 2023-12-30 MED ORDER — ONDANSETRON HCL 4 MG PO TABS: 4.0000 mg | ORAL_TABLET | Freq: Four times a day (QID) | ORAL | Status: DC | PRN

## 2023-12-30 MED ORDER — ONDANSETRON HCL 4 MG/2ML IJ SOLN: 4.0000 mg | Freq: Four times a day (QID) | INTRAMUSCULAR | Status: DC | PRN

## 2023-12-30 MED ORDER — SODIUM CHLORIDE 0.9 % IV BOLUS
1000.0000 mL | Freq: Once | INTRAVENOUS | Status: AC
  Administered 2023-12-30: 1000 mL via INTRAVENOUS

## 2023-12-30 MED ORDER — ACETAMINOPHEN 325 MG PO TABS
650.0000 mg | ORAL_TABLET | Freq: Four times a day (QID) | ORAL | Status: DC | PRN
  Administered 2023-12-31: 650 mg via ORAL
  Filled 2023-12-30 (×2): qty 2

## 2023-12-30 MED ORDER — HYDROCODONE-ACETAMINOPHEN 5-325 MG PO TABS
2.0000 | ORAL_TABLET | Freq: Once | ORAL | Status: AC
  Administered 2023-12-30: 2 via ORAL
  Filled 2023-12-30: qty 2

## 2023-12-30 MED ORDER — VITAMIN D (ERGOCALCIFEROL) 1.25 MG (50000 UNIT) PO CAPS: 50000.0000 [IU] | ORAL_CAPSULE | ORAL | Status: DC

## 2023-12-30 MED ORDER — FAMOTIDINE 20 MG PO TABS: 20.0000 mg | ORAL_TABLET | Freq: Two times a day (BID) | ORAL | Status: DC

## 2023-12-30 MED ORDER — ACETAMINOPHEN 650 MG RE SUPP: 650.0000 mg | Freq: Four times a day (QID) | RECTAL | Status: DC | PRN

## 2023-12-30 MED ORDER — ONDANSETRON 8 MG PO TBDP
8.0000 mg | ORAL_TABLET | Freq: Once | ORAL | Status: AC
  Administered 2023-12-30: 8 mg via ORAL

## 2023-12-30 MED ORDER — LEVOFLOXACIN IN D5W 750 MG/150ML IV SOLN
750.0000 mg | INTRAVENOUS | Status: DC
  Administered 2023-12-31 – 2024-01-01 (×2): 750 mg via INTRAVENOUS
  Filled 2023-12-30 (×2): qty 150

## 2023-12-30 MED ORDER — TRAZODONE HCL 50 MG PO TABS: 25.0000 mg | ORAL_TABLET | Freq: Every evening | ORAL | Status: DC | PRN

## 2023-12-30 NOTE — ED Notes (Signed)
 Patient is being discharged from the Urgent Care and sent to the Emergency Department via EMS . Per    Domenick Gong, MD  , patient is in need of higher level of care due to Kidney Stone . Patient is aware and verbalizes understanding of plan of care.  Vitals:   12/30/23 1543  BP: (!) 144/70  Pulse: 72  Resp: (!) 24  SpO2: 97%

## 2023-12-30 NOTE — ED Triage Notes (Signed)
 Patient presents from [facility]gent care via [facility] for kidney stone rupture. Seen at [facility] for kidney stone like symptoms and upon receiving toradol, pt reported a "popping" sensation in lower left abdomen.  [facility]: HR 96 100% RA BP: 156/125, 178/110  8mg  zofran, 30mg  ketorolac received at urgent care.   Menopausal x 3-4 years  Last kidney stone 1997

## 2023-12-30 NOTE — H&P (Signed)
 [place]   PATIENT NAME: Deborah Mclean    MR#:  [id number]  DATE OF BIRTH:  December 26, 1970  DATE OF ADMISSION:  12/30/2023  PRIMARY CARE PHYSICIAN: Patient, No Pcp Per   Patient is coming from: Home  REQUESTING/REFERRING PHYSICIAN: Sharyn Creamer, MD  CHIEF COMPLAINT:   Chief Complaint  Patient presents with   Flank Pain    HISTORY OF PRESENT ILLNESS:  Deborah Mclean is a 53 y.o. Caucasian female with medical history significant for GERD, seasonal allergies and vertigo, who presented to the emergency room with acute onset of left flank pain that started today with associated nausea and vomiting.  She denied any fever or chills.  No dysuria, urinary frequency or urgency.  No chest pain or palpitations.  No cough or wheezing or dyspnea.  No bleeding diathesis.  ED Course: When she came to the ER, respiratory rate was 24 and later 22 blood pressure 144/70 and later 165/101 with otherwise normal vital signs.  Labs revealed CO2 of 16 with AST of 48 and ALT of 74 otherwise CMP was normal.  Lactic acid was 3.2.  CBC showed leukocytosis 16.1 with hemoconcentration.  UA showed 11-20 WBCs more than 50 RBCs few bacteria and trace leukocytes with 100 protein.  Blood and urine cultures were sent. EKG as reviewed by me : None Imaging: CT renal stone revealed the following: 1. 2 mm obstructing renal calculus within the proximal left ureter. 2. Additional, nonobstructing left renal calculi. 3. Evidence of prior appendectomy.  The patient was given 4 mg of IV Zofran, 0.4 mg of p.o. Flomax and 1 L bolus of IV normal saline.  She will be admitted to a medical telemetry bed for further evaluation and management.  PAST MEDICAL HISTORY:   Past Medical History:  Diagnosis Date   GERD (gastroesophageal reflux disease)    History of kidney stones    2006, 2012   PONV (postoperative nausea and vomiting)     PAST SURGICAL HISTORY:   Past Surgical History:  Procedure Laterality Date    ABDOMINAL SURGERY  2010   APPENDECTOMY  1997   BREAST BIOPSY Right 10/16/2016   FIBROEPITHELIAL LESION WITH PROMINENT INTRACANALICULAR GROWTH PATTERN   BREAST LUMPECTOMY Right 11/01/2016   BENIGN PHYLLODES TUMOR, 1.1 CM. /Procedure: BREAST LUMPECTOMY;  Surgeon: Kieth Brightly, MD;  Location: [facility] ORS;  Service: General;  Laterality: Right;   CESAREAN SECTION  2009, 2012    SOCIAL HISTORY:   Social History   Tobacco Use   Smoking status: Never   Smokeless tobacco: Never  Substance Use Topics   Alcohol use: No    FAMILY HISTORY:   Family History  Problem Relation Age of Onset   Hyperlipidemia Mother    Hypertension Father    Hyperlipidemia Father    Breast cancer Neg Hx     DRUG ALLERGIES:   Allergies  Allergen Reactions   Amoxicillin-Pot Clavulanate Anaphylaxis    Has patient had a PCN reaction causing immediate rash, facial/tongue/throat swelling, SOB or lightheadedness with hypotension: Yes Has patient had a PCN reaction causing severe rash involving mucus membranes or skin necrosis: No Has patient had a PCN reaction that required hospitalization No Has patient had a PCN reaction occurring within the last 10 years: Yes If all of the above answers are "NO", then may proceed with Cephalosporin use.     REVIEW OF SYSTEMS:   ROS As per history of present illness. All pertinent systems were reviewed above. Constitutional,  HEENT, cardiovascular, respiratory, GI, GU, musculoskeletal, neuro, psychiatric, endocrine, integumentary and hematologic systems were reviewed and are otherwise negative/unremarkable except for positive findings mentioned above in the HPI.   MEDICATIONS AT HOME:   Prior to Admission medications   Medication Sig Start Date End Date Taking? Authorizing Provider  cetirizine (ZYRTEC) 10 MG tablet Take 1 tablet (10 mg total) by mouth daily. 08/19/17   Candis Schatz, PA-C  famotidine (PEPCID) 20 MG tablet Take 1 tablet (20 mg total) by mouth 2  (two) times daily. 07/05/19   Domenick Gong, MD  levonorgestrel (MIRENA) 20 MCG/24HR IUD 1 each by Intrauterine route once.    [provider]  Multiple Vitamin (MULTIVITAMIN) capsule Take 1 capsule by mouth daily.    [provider]  pantoprazole (PROTONIX) 20 MG tablet Take 1 tablet (20 mg total) by mouth daily. 07/05/19   Domenick Gong, MD  SIMETHICONE PO Take by mouth.    [provider]  Vitamin D, Ergocalciferol, (DRISDOL) 50000 units CAPS capsule Take 50,000 Units by mouth every 7 (seven) days.    [provider]      VITAL SIGNS:  Blood pressure (!) 151/91, pulse 64, temperature 97.6 F (36.4 C), temperature source Oral, resp. rate 19, height 5\' 6"  (1.676 m), weight 93.9 kg, last menstrual period 12/24/2017, SpO2 94%.  PHYSICAL EXAMINATION:  Physical Exam  GENERAL:  53 y.o.-year-old Caucasian female patient lying in the bed with no acute distress.  EYES: Pupils equal, round, reactive to light and accommodation. No scleral icterus. Extraocular muscles intact.  HEENT: Head atraumatic, normocephalic. Oropharynx and nasopharynx clear.  NECK:  Supple, no jugular venous distention. No thyroid enlargement, no tenderness.  LUNGS: Normal breath sounds bilaterally, no wheezing, rales,rhonchi or crepitation. No use of accessory muscles of respiration.  CARDIOVASCULAR: Regular rate and rhythm, S1, S2 normal. No murmurs, rubs, or gallops.  ABDOMEN: Soft, nondistended, nontender. Bowel sounds present. No organomegaly or mass.  Left mid abdominal tenderness as well as left CVA tenderness EXTREMITIES: No pedal edema, cyanosis, or clubbing.  NEUROLOGIC: Cranial nerves II through XII are intact. Muscle strength 5/5 in all extremities. Sensation intact. Gait not checked.  PSYCHIATRIC: The patient is alert and oriented x 3.  Normal affect and good eye contact. SKIN: No obvious rash, lesion, or ulcer.   LABORATORY PANEL:   CBC Recent Labs  Lab  12/30/23 1810  WBC 16.1*  HGB 15.7*  HCT 46.7*  PLT 387   ------------------------------------------------------------------------------------------------------------------  Chemistries  Recent Labs  Lab 12/30/23 1810  NA 141  K 3.7  CL 110  CO2 16*  GLUCOSE 116*  BUN 16  CREATININE 0.88  CALCIUM 9.6  AST 48*  ALT 74*  ALKPHOS 96  BILITOT 0.6   ------------------------------------------------------------------------------------------------------------------  Cardiac Enzymes No results for input(s): "TROPONINI" in the last 168 hours. ------------------------------------------------------------------------------------------------------------------  RADIOLOGY:  DG OR UROLOGY CYSTO IMAGE ([facility] ONLY) Result Date: 12/31/2023 There is no interpretation for this exam.  This order is for images obtained during a surgical procedure.  Please See "Surgeries" Tab for more information regarding the procedure.   CT Renal Stone Study Result Date: 12/30/2023 CLINICAL DATA:  Left flank pain. EXAM: CT ABDOMEN AND PELVIS WITHOUT CONTRAST TECHNIQUE: Multidetector CT imaging of the abdomen and pelvis was performed following the standard protocol without IV contrast. RADIATION DOSE REDUCTION: This exam was performed according to the departmental dose-optimization program which includes automated exposure control, adjustment of the mA and/or kV according to patient size and/or use of iterative reconstruction  technique. COMPARISON:  None Available. FINDINGS: Lower chest: No acute abnormality. Hepatobiliary: No focal liver abnormality is seen. The gallbladder is not identified. No biliary dilatation. Pancreas: Unremarkable. No pancreatic ductal dilatation or surrounding inflammatory changes. Spleen: Normal in size without focal abnormality. Adrenals/Urinary Tract: Adrenal glands are unremarkable. Kidneys are normal in size, without focal lesions. 2 mm and 3 mm nonobstructing renal calculi are seen within  the left kidney. A 2 mm obstructing renal calculus is seen within the proximal left ureter, with mild to moderate severity left-sided hydronephrosis, hydroureter and perinephric inflammatory fat stranding. Bladder is unremarkable. Stomach/Bowel: Stomach is within normal limits. The appendix is surgically absent. No evidence of bowel wall thickening, distention, or inflammatory changes. Vascular/Lymphatic: No significant vascular findings are present. No enlarged abdominal or pelvic lymph nodes. Reproductive: Uterus and bilateral adnexa are unremarkable. Other: No abdominal wall hernia or abnormality. No abdominopelvic ascites. Musculoskeletal: No acute or significant osseous findings. IMPRESSION: 1. 2 mm obstructing renal calculus within the proximal left ureter. 2. Additional, nonobstructing left renal calculi. 3. Evidence of prior appendectomy. Electronically Signed   By: Aram Candela M.D.   On: 12/30/2023 19:42      IMPRESSION AND PLAN:  Assessment and Plan: * Acute unilateral obstructive uropathy - This is associated with left hydroureter and hydronephrosis with perinephric stranding. - It is in the setting of sepsis with UTI. - She will be admitted to a medical telemetry bed. - Pain management will be provided. - We will continue to pack therapy with IV Rocephin. - Urology consult was sought. - Dr. Richardo Hanks was notified and is aware about the patient.  Sepsis due to gram-negative UTI [facility]) - This is manifested by leukocytosis and tachypnea. - The patient meets severe sepsis criteria based on elevated lactic acid. - Will place on IV Rocephin. - We will follow blood and urine cultures. - We will continue hydration with IV lactated ringer.  Hypertensive urgency - This is likely secondary to pain. -We will place on as needed IV hydralazine.  Seasonal allergies - We will continue her antihistaminic therapy.  GERD without esophagitis - We will continue PPI therapy as well as H2  blocker therapy.     DVT prophylaxis: SCDs.  Medical prophylaxis held off given her microscopic hematuria. Advanced Care Planning:  Code Status: full code. Family Communication:  The plan of care was discussed in details with the patient (and family). I answered all questions. The patient agreed to proceed with the above mentioned plan. Further management will depend upon hospital course. Disposition Plan: Back to previous home environment Consults called: Urology. All the records are reviewed and case discussed with ED provider.  Status is: Inpatient   At the time of the admission, it appears that the appropriate admission status for this patient is inpatient.  This is judged to be reasonable and necessary in order to provide the required intensity of service to ensure the patient's safety given the presenting symptoms, physical exam findings and initial radiographic and laboratory data in the context of comorbid conditions.  The patient requires inpatient status due to high intensity of service, high risk of further deterioration and high frequency of surveillance required.  I certify that at the time of admission, it is my clinical judgment that the patient will require inpatient hospital care extending more than 2 midnights.                            Dispo: The  patient is from: Home              Anticipated d/c is to: Home              Patient currently is not medically stable to d/c.              Difficult to place patient: No  Hannah Beat M.D on 12/31/2023 at 12:55 AM  [facility]   From 7 PM-7 AM, contact night-coverage [email or web address]  CC: Primary care physician; Patient, No Pcp Per

## 2023-12-30 NOTE — ED Notes (Signed)
 Pt provided with urine cup for urine sample

## 2023-12-30 NOTE — ED Provider Notes (Signed)
 HPI  SUBJECTIVE:  Deborah Mclean is a 53 y.o. female who presents with the acute onset of left low back pain that radiates down her left flank starting around 1500 today.  She reports feeling diaphoretic, with nausea and vomiting.  She states this feels identical to previous kidney stones.  No urinary complaints.  She took ibuprofen 400 mg at 1521, which she vomited up here.  No aggravating or alleviating factors.  She has a past medical history of nonobstructing nephrolithiasis, vertigo, and is status post cholecystectomy.   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 /Procedure: BREAST LUMPECTOMY;  Surgeon: Kieth Brightly, MD;  Location: [facility];  Service: General;  Laterality: Right;   CESAREAN SECTION  2009, 2012    Family History  Problem Relation Age of Onset   Hyperlipidemia Mother    Hypertension Father    Hyperlipidemia Father    Breast cancer Neg Hx     Social History   Tobacco Use   Smoking status: Never   Smokeless tobacco: Never  Vaping Use   Vaping status: Never Used  Substance Use Topics   Alcohol use: No   Drug use: No     Current Facility-Administered Medications:    acetaminophen (TYLENOL) tablet 975 mg, 975 mg, Oral, Once, Domenick Gong, MD   ketorolac (TORADOL) 30 MG/ML injection 30 mg, 30 mg, Intramuscular, Once, Domenick Gong, MD   ondansetron (ZOFRAN-ODT) disintegrating tablet 8 mg, 8 mg, Oral, Once, Domenick Gong, MD  Current Outpatient Medications:    cetirizine (ZYRTEC) 10 MG tablet, Take 1 tablet (10 mg total) by mouth  daily., Disp: 30 tablet, Rfl: 0   famotidine (PEPCID) 20 MG tablet, Take 1 tablet (20 mg total) by mouth 2 (two) times daily., Disp: 40 tablet, Rfl: 0   levonorgestrel (MIRENA) 20 MCG/24HR IUD, 1 each by Intrauterine route once., Disp: , Rfl:    Multiple Vitamin (MULTIVITAMIN) capsule, Take 1 capsule by mouth daily., Disp: , Rfl:    pantoprazole (PROTONIX) 20 MG tablet, Take 1 tablet (20 mg total) by mouth daily., Disp: 30 tablet, Rfl: 0   SIMETHICONE PO, Take by mouth., Disp: , Rfl:    Vitamin D, Ergocalciferol, (DRISDOL) 50000 units CAPS capsule, Take 50,000 Units by mouth every 7 (seven) days., Disp: , Rfl:   Allergies  Allergen Reactions   Amoxicillin-Pot Clavulanate Anaphylaxis    Has patient had a PCN reaction causing immediate rash, facial/tongue/throat swelling, SOB or lightheadedness with hypotension: Yes Has patient had a PCN reaction causing severe rash involving mucus membranes or skin necrosis: No Has patient had a PCN reaction that required hospitalization No Has patient had a PCN reaction occurring within the last 10 years: Yes If all of the above answers are "NO", then may proceed with Cephalosporin use.      ROS  As noted in HPI.   Physical Exam  BP (!) 144/70 (BP Location: Right Arm)   Pulse 72   Resp (!) 24   LMP 12/24/2017 (Approximate)   SpO2 97%   Constitutional: Well developed, well nourished, appears to  be in a severe amount of pain, writhing around the table Eyes:  EOMI, conjunctiva normal bilaterally HENT: Normocephalic, atraumatic,mucus membranes moist Respiratory: Normal inspiratory effort Cardiovascular: Normal rate GI: nondistended.  Positive left flank and suprapubic tenderness.  No CVAT.  Hypoactive bowel sounds.  No distention.  No rebound, guarding. skin: No rash, skin intact Musculoskeletal: no deformities Neurologic: Alert & oriented x 3, no focal neuro deficits Psychiatric: Speech and behavior appropriate   ED Course   Medications   acetaminophen (TYLENOL) tablet 975 mg (has no administration in time range)  ketorolac (TORADOL) 30 MG/ML injection 30 mg (has no administration in time range)  ondansetron (ZOFRAN-ODT) disintegrating tablet 8 mg (has no administration in time range)    No orders of the defined types were placed in this encounter.   No results found for this or any previous visit (from the past 24 hours). No results found.  ED Clinical Impression  1. Flank pain   2. History of nephrolithiasis   3. Nausea and vomiting, unspecified vomiting type   4. Sudden onset of severe abdominal pain      ED Assessment/Plan     Patient vomited up the ibuprofen here that she was given by her sister.  Patient states that she feels as if she is about to "pass out" and her sister is concerned that she may do so, so patient and sister would like for the patient to go via EMS.  Discussed with them that we do not have medications beyond simple analgesics and antiemetics and I do not think that this would adequately control her pain, we also do not have CT available here today to evaluate for obstruction.  Transferring to the emergency department via EMS to rule out obstructing nephrolithiasis.  Low in the differential is aortic abdominal aneurysm, acute obstruction, intestinal perforation.  Giving Toradol 30 mg IM, Tylenol 975 mg p.o., Zofran 8 mg ODT.   Meds ordered this encounter  Medications   acetaminophen (TYLENOL) tablet 975 mg   ketorolac (TORADOL) 30 MG/ML injection 30 mg   ondansetron (ZOFRAN-ODT) disintegrating tablet 8 mg      *This clinic note was created using Scientist, clinical (histocompatibility and immunogenetics). Therefore, there may be occasional mistakes despite careful proofreading.  ?    Domenick Gong, MD 12/30/23 1556

## 2023-12-30 NOTE — Sepsis Progress Note (Addendum)
 Elink following for sepsis protocol, sepsis protocol called at 23:35, cultures drawn at 17:51, VSS, Abx's given at 22:39,   Lactic at 22:55 was  3.2, I liter of fluids given

## 2023-12-30 NOTE — ED Provider Notes (Signed)
 [facility] Provider Note   Event Date/Time   First MD Initiated Contact with Patient 12/30/23 1754     (approximate)  History   Flank Pain  HPI  Deborah Mclean is a 53 y.o. female reports a history of nephrolithiasis  And this morning had abrupt onset of severe pain in her left flank.  Associated with nausea and vomiting due to the pain.  No fevers or chills no pain or burning with urination.  She reports she has almost identical symptoms when she passed a kidney stone once previous.  No fevers no chills   She also reports she went through menopause about 3 years ago.   She went to urgent care and right after they gave her a shot of pain medicine she felt like a "pop" feeling in the left flank area, but advises over the last 20 minutes her pain has started to settle some but still fairly severe in the left flank.  Physical Exam   Triage Vital Signs: ED Triage Vitals [12/30/23 1642]  Encounter Vitals Group     BP (!) 165/101     Systolic BP Percentile      Diastolic BP Percentile      Pulse Rate 61     Resp (!) 22     Temp      Temp src      SpO2 99 %     Weight      Height      Head Circumference      Peak Flow      Pain Score 10     Pain Loc      Pain Education      Exclude from Growth Chart     Most recent vital signs: Vitals:   12/30/23 1642  BP: (!) 165/101  Pulse: 61  Resp: (!) 22  SpO2: 99%     General: Awake, no distress.  Sitting up appears in painful distress.  Fully alert and oriented.  Cannot seem to find a position of comfort somewhat writhing CV:  Good peripheral perfusion.  Normal tones.  Strong palpable pulses including posterior tibial in both feet bilaterally Resp:  Normal effort.  Clear bilateral Abd:  No distention.  Soft nontender nondistended except in the left flank she reports tenderness and also has fairly severe tenderness to percussion of the left costovertebral angle only Other:     ED Results /  Procedures / Treatments   Labs (all labs ordered are listed, but only abnormal results are displayed) Labs Reviewed  URINALYSIS, ROUTINE W REFLEX MICROSCOPIC - Abnormal; Notable for the following components:      Result Value   Color, Urine AMBER (*)    APPearance CLOUDY (*)    Hgb urine dipstick MODERATE (*)    Ketones, ur 5 (*)    Protein, ur 100 (*)    Leukocytes,Ua TRACE (*)    Bacteria, UA FEW (*)    All other components within normal limits  CBC - Abnormal; Notable for the following components:   WBC 16.1 (*)    RBC 5.15 (*)    Hemoglobin 15.7 (*)    HCT 46.7 (*)    All other components within normal limits  COMPREHENSIVE METABOLIC PANEL - Abnormal; Notable for the following components:   CO2 16 (*)    Glucose, Bld 116 (*)    AST 48 (*)    ALT 74 (*)    All other components within normal limits  URINE CULTURE  CULTURE, BLOOD (ROUTINE X 2)  CULTURE, BLOOD (ROUTINE X 2)  LIPASE, BLOOD  LACTIC ACID, PLASMA  HIV ANTIBODY (ROUTINE TESTING W REFLEX)  PROTIME-INR  CORTISOL-AM, BLOOD  BASIC METABOLIC PANEL  CBC     RADIOLOGY  CT Renal Stone Study Result Date: 12/30/2023 CLINICAL DATA:  Left flank pain. EXAM: CT ABDOMEN AND PELVIS WITHOUT CONTRAST TECHNIQUE: Multidetector CT imaging of the abdomen and pelvis was performed following the standard protocol without IV contrast. RADIATION DOSE REDUCTION: This exam was performed according to the departmental dose-optimization program which includes automated exposure control, adjustment of the mA and/or kV according to patient size and/or use of iterative reconstruction technique. COMPARISON:  None Available. FINDINGS: Lower chest: No acute abnormality. Hepatobiliary: No focal liver abnormality is seen. The gallbladder is not identified. No biliary dilatation. Pancreas: Unremarkable. No pancreatic ductal dilatation or surrounding inflammatory changes. Spleen: Normal in size without focal abnormality. Adrenals/Urinary Tract: Adrenal  glands are unremarkable. Kidneys are normal in size, without focal lesions. 2 mm and 3 mm nonobstructing renal calculi are seen within the left kidney. A 2 mm obstructing renal calculus is seen within the proximal left ureter, with mild to moderate severity left-sided hydronephrosis, hydroureter and perinephric inflammatory fat stranding. Bladder is unremarkable. Stomach/Bowel: Stomach is within normal limits. The appendix is surgically absent. No evidence of bowel wall thickening, distention, or inflammatory changes. Vascular/Lymphatic: No significant vascular findings are present. No enlarged abdominal or pelvic lymph nodes. Reproductive: Uterus and bilateral adnexa are unremarkable. Other: No abdominal wall hernia or abnormality. No abdominopelvic ascites. Musculoskeletal: No acute or significant osseous findings. IMPRESSION: 1. 2 mm obstructing renal calculus within the proximal left ureter. 2. Additional, nonobstructing left renal calculi. 3. Evidence of prior appendectomy. Electronically Signed   By: Aram Candela M.D.   On: 12/30/2023 19:42      PROCEDURES:  Critical Care performed: No  Procedures   MEDICATIONS ORDERED IN ED: Medications  ciprofloxacin (CIPRO) IVPB 400 mg (400 mg Intravenous New Bag/Given 12/30/23 2239)  famotidine (PEPCID) tablet 20 mg (has no administration in time range)  pantoprazole (PROTONIX) EC tablet 20 mg (has no administration in time range)  simethicone (MYLICON) chewable tablet 80 mg (has no administration in time range)  multivitamin capsule 1 capsule (has no administration in time range)  Vitamin D (Ergocalciferol) (DRISDOL) 1.25 MG (50000 UNIT) capsule 50,000 Units (has no administration in time range)  loratadine (CLARITIN) tablet 10 mg (has no administration in time range)  lactated ringers infusion (has no administration in time range)  enoxaparin (LOVENOX) injection 40 mg (has no administration in time range)  acetaminophen (TYLENOL) tablet 650 mg  (has no administration in time range)    Or  acetaminophen (TYLENOL) suppository 650 mg (has no administration in time range)  traZODone (DESYREL) tablet 25 mg (has no administration in time range)  magnesium hydroxide (MILK OF MAGNESIA) suspension 30 mL (has no administration in time range)  ondansetron (ZOFRAN) tablet 4 mg (has no administration in time range)    Or  ondansetron (ZOFRAN) injection 4 mg (has no administration in time range)  levofloxacin (LEVAQUIN) IVPB 750 mg (has no administration in time range)  morphine (PF) 4 MG/ML injection 6 mg (6 mg Intravenous Given 12/30/23 1826)  morphine (PF) 4 MG/ML injection 4 mg (4 mg Intravenous Given 12/30/23 1936)  sodium chloride 0.9 % bolus 1,000 mL (0 mLs Intravenous Stopped 12/30/23 2101)  HYDROcodone-acetaminophen (NORCO/VICODIN) 5-325 MG per tablet 2 tablet (2 tablets Oral Given 12/30/23 2045)  tamsulosin (FLOMAX) capsule 0.4 mg (0.4 mg Oral Given 12/30/23 2234)  morphine (PF) 4 MG/ML injection 4 mg (4 mg Intravenous Given 12/30/23 2234)  ondansetron (ZOFRAN) injection 4 mg (4 mg Intravenous Given 12/30/23 2234)     IMPRESSION / MDM / ASSESSMENT AND PLAN / ED COURSE  I reviewed the triage vital signs and the nursing notes.                              Differential diagnosis includes but is not limited to, abdominal perforation, aortic dissection, cholecystitis, appendicitis, diverticulitis, colitis, esophagitis/gastritis, kidney stone, pyelonephritis, urinary tract infection, aortic aneurysm. All are considered in decision and treatment plan. Based upon the patient's presentation and risk factors, as well as the abrupt onset of severe left flank pain with a history of nephrolithiasis I suspect there is a high likelihood this is acutely urologic.  She has no fever she does not appear acutely toxic but does appear to have severe pain.  Will place IV start morphine.  Has already achieved Toradol which was given at urgent care.  Attempted to  check patient's temperature, currently drinking ice water.  Will attempt again later   Patient's presentation is most consistent with acute complicated illness / injury requiring diagnostic workup.   ----------------------------------------- 10:25 PM on 12/30/2023 ----------------------------------------- Patient pain starting to ramp up again vomited once.  Flank pain escalating up to 8 out of 10 despite use of hydrocodone and 10 mg of morphine preceding this.  Given the nature of the patient's intractable pain elevated white count few bacteria in the urine will initiate the patient on additional morphine, discussed with her and will admit for intractable pain in the setting of left-sided nephrolithiasis.  Urine culture ordered.  Patient reports a severe allergy with a severe rash to penicillin.   Clinical Course as of 12/30/23 2311  Mon Dec 30, 2023  2038 Patient's pain improving.  Reports she is quite thirsty.  Discussed her diagnosis of kidney stone with her and plans for conservative management.  Currently awaiting urinalysis denies any fevers chills or obvious systemic symptoms of infection.  No dysuria.  Will hydrate, provide hydrocodone pain currently mild fully awake and alert.  Appears much improved [MQ]    Clinical Course User Index [MQ] Sharyn Creamer, MD   Patient and her husband both understand agreeable plan for admission.  I consulted with and patient accepted to hospital service by Dr. Arville Care.  After speaking with Dr. Mertie Moores, additionally I placed a consult with Dr. Richardo Hanks who has reviewed case with me and will provide consult.  Patient be admitted to hospitalist services.   FINAL CLINICAL IMPRESSION(S) / ED DIAGNOSES   Final diagnoses:  Kidney stone on left side  Bacteria in urine     Rx / DC Orders   ED Discharge Orders     None        Note:  This document was prepared using Dragon voice recognition software and may include unintentional dictation errors.    Sharyn Creamer, MD 12/30/23 [phone]

## 2023-12-30 NOTE — ED Triage Notes (Addendum)
 Patient states that pain started in lower back about 45 min ago. Pain is now in lower abdominal area. Hx of kidney stones. Nausea vomiting.

## 2023-12-31 ENCOUNTER — Other Ambulatory Visit: Payer: Self-pay | Admitting: Urology

## 2023-12-31 ENCOUNTER — Inpatient Hospital Stay: Payer: BLUE CROSS/BLUE SHIELD

## 2023-12-31 ENCOUNTER — Other Ambulatory Visit: Payer: Self-pay

## 2023-12-31 ENCOUNTER — Encounter
Admission: EM | Disposition: A | Discharge status: Home / Self Care | Payer: Self-pay | Source: Home / Self Care | Attending: Internal Medicine

## 2023-12-31 ENCOUNTER — Inpatient Hospital Stay: Payer: Self-pay | Admitting: Anesthesiology

## 2023-12-31 DIAGNOSIS — I160 Hypertensive urgency: Secondary | ICD-10-CM

## 2023-12-31 DIAGNOSIS — N201 Calculus of ureter: Secondary | ICD-10-CM

## 2023-12-31 DIAGNOSIS — K219 Gastro-esophageal reflux disease without esophagitis: Secondary | ICD-10-CM | POA: Insufficient documentation

## 2023-12-31 DIAGNOSIS — A419 Sepsis, unspecified organism: Secondary | ICD-10-CM

## 2023-12-31 DIAGNOSIS — N390 Urinary tract infection, site not specified: Secondary | ICD-10-CM | POA: Diagnosis not present

## 2023-12-31 DIAGNOSIS — J302 Other seasonal allergic rhinitis: Secondary | ICD-10-CM | POA: Insufficient documentation

## 2023-12-31 DIAGNOSIS — N139 Obstructive and reflux uropathy, unspecified: Secondary | ICD-10-CM | POA: Diagnosis not present

## 2023-12-31 DIAGNOSIS — N200 Calculus of kidney: Secondary | ICD-10-CM

## 2023-12-31 HISTORY — PX: CYSTOSCOPY WITH STENT PLACEMENT: SHX5790

## 2023-12-31 LAB — BASIC METABOLIC PANEL
Anion gap: 7 (ref 5–15)
BUN: 16 mg/dL (ref 6–20)
CO2: 22 mmol/L (ref 22–32)
Calcium: 9 mg/dL (ref 8.9–10.3)
Chloride: 109 mmol/L (ref 98–111)
Creatinine, Ser: 0.67 mg/dL (ref 0.44–1.00)
GFR, Estimated: >60 mL/min (ref >60)
Glucose, Bld: 156 mg/dL — ABNORMAL HIGH (ref 70–99)
Potassium: 4.1 mmol/L (ref 3.5–5.1)
Sodium: 138 mmol/L (ref 135–145)

## 2023-12-31 LAB — LACTIC ACID, PLASMA
Lactic Acid, Venous: 2.5 mmol/L (ref 0.5–1.9)
Lactic Acid, Venous: 1.3 mmol/L (ref 0.5–1.9)

## 2023-12-31 LAB — CBC
HCT: 41.7 % (ref 36.0–46.0)
Hemoglobin: 14.3 g/dL (ref 12.0–15.0)
MCH: 29.8 pg (ref 26.0–34.0)
MCHC: 34.3 g/dL (ref 30.0–36.0)
MCV: 86.9 fL (ref 80.0–100.0)
Platelets: 398 10*3/uL (ref 150–400)
RBC: 4.8 MIL/uL (ref 3.87–5.11)
RDW: 12.9 % (ref 11.5–15.5)
WBC: 13.7 10*3/uL — ABNORMAL HIGH (ref 4.0–10.5)
nRBC: 0 % (ref 0.0–0.2)

## 2023-12-31 LAB — PROTIME-INR
INR: 1 (ref 0.8–1.2)
Prothrombin Time: 13.8 s (ref 11.4–15.2)

## 2023-12-31 LAB — HIV ANTIBODY (ROUTINE TESTING W REFLEX): HIV Screen 4th Generation wRfx: NONREACTIVE

## 2023-12-31 LAB — CORTISOL-AM, BLOOD: Cortisol - AM: 6.8 ug/dL (ref 6.7–22.6)

## 2023-12-31 SURGERY — CYSTOSCOPY, WITH STENT INSERTION
Anesthesia: General | Laterality: Left

## 2023-12-31 MED ORDER — PROPOFOL 10 MG/ML IV BOLUS
INTRAVENOUS | Status: AC
  Filled 2023-12-31: qty 20

## 2023-12-31 MED ORDER — PROPOFOL 1000 MG/100ML IV EMUL
INTRAVENOUS | Status: AC
  Filled 2023-12-31: qty 100

## 2023-12-31 MED ORDER — SODIUM CHLORIDE 0.9 % IR SOLN
Status: DC | PRN
  Administered 2023-12-31: 600 mL

## 2023-12-31 MED ORDER — SENNOSIDES-DOCUSATE SODIUM 8.6-50 MG PO TABS: 1.0000 | ORAL_TABLET | Freq: Two times a day (BID) | ORAL | Status: DC

## 2023-12-31 MED ORDER — DEXAMETHASONE SODIUM PHOSPHATE 10 MG/ML IJ SOLN
INTRAMUSCULAR | Status: DC | PRN
  Administered 2023-12-31: 10 mg via INTRAVENOUS

## 2023-12-31 MED ORDER — ONDANSETRON HCL 4 MG/2ML IJ SOLN
INTRAMUSCULAR | Status: DC | PRN
  Administered 2023-12-31: 4 mg via INTRAVENOUS

## 2023-12-31 MED ORDER — PROPOFOL 10 MG/ML IV BOLUS
INTRAVENOUS | Status: DC | PRN
Start: 2023-12-31 — End: 2023-12-31
  Administered 2023-12-31: 120 mg via INTRAVENOUS

## 2023-12-31 MED ORDER — MENTHOL 3 MG MT LOZG
1.0000 | LOZENGE | OROMUCOSAL | Status: DC | PRN
  Administered 2023-12-31: 3 mg via ORAL
  Filled 2023-12-31: qty 9

## 2023-12-31 MED ORDER — ACETAMINOPHEN 10 MG/ML IV SOLN: 1000.0000 mg | Freq: Once | INTRAVENOUS | Status: DC | PRN

## 2023-12-31 MED ORDER — DIPHENHYDRAMINE HCL 50 MG/ML IJ SOLN
INTRAMUSCULAR | Status: DC | PRN
  Administered 2023-12-31: 12.5 mg via INTRAVENOUS

## 2023-12-31 MED ORDER — MIDAZOLAM HCL 2 MG/2ML IJ SOLN
INTRAMUSCULAR | Status: DC | PRN
  Administered 2023-12-31: 2 mg via INTRAVENOUS

## 2023-12-31 MED ORDER — MORPHINE SULFATE (PF) 2 MG/ML IV SOLN: 2.0000 mg | INTRAVENOUS | Status: DC | PRN

## 2023-12-31 MED ORDER — DROPERIDOL 2.5 MG/ML IJ SOLN: 0.6250 mg | Freq: Once | INTRAMUSCULAR | Status: DC | PRN

## 2023-12-31 MED ORDER — IOHEXOL 180 MG/ML  SOLN
INTRAMUSCULAR | Status: DC | PRN
Start: 2023-12-31 — End: 2023-12-31
  Administered 2023-12-31: 5 mL

## 2023-12-31 MED ORDER — KETOROLAC TROMETHAMINE 15 MG/ML IJ SOLN
15.0000 mg | Freq: Four times a day (QID) | INTRAMUSCULAR | Status: DC | PRN
  Administered 2023-12-31: 15 mg via INTRAVENOUS
  Filled 2023-12-31: qty 1

## 2023-12-31 MED ORDER — SALINE SPRAY 0.65 % NA SOLN
1.0000 | NASAL | Status: DC | PRN
  Administered 2023-12-31: 1 via NASAL
  Filled 2023-12-31: qty 44

## 2023-12-31 MED ORDER — OXYCODONE HCL 5 MG PO TABS
5.0000 mg | ORAL_TABLET | Freq: Once | ORAL | Status: AC | PRN
  Administered 2023-12-31: 5 mg via ORAL

## 2023-12-31 MED ORDER — ONDANSETRON HCL 4 MG/2ML IJ SOLN: 4.0000 mg | Freq: Once | INTRAMUSCULAR | Status: DC | PRN

## 2023-12-31 MED ORDER — IBUPROFEN 400 MG PO TABS
400.0000 mg | ORAL_TABLET | Freq: Four times a day (QID) | ORAL | Status: DC | PRN
Start: 2023-12-31 — End: 2024-01-01

## 2023-12-31 MED ORDER — FENTANYL CITRATE (PF) 100 MCG/2ML IJ SOLN
INTRAMUSCULAR | Status: AC
  Filled 2023-12-31: qty 2

## 2023-12-31 MED ORDER — KETOROLAC TROMETHAMINE 30 MG/ML IJ SOLN
INTRAMUSCULAR | Status: DC | PRN
  Administered 2023-12-31: 30 mg via INTRAVENOUS

## 2023-12-31 MED ORDER — OXYBUTYNIN CHLORIDE ER 10 MG PO TB24: 10.0000 mg | ORAL_TABLET | Freq: Every day | ORAL | Status: DC | PRN

## 2023-12-31 MED ORDER — KETOROLAC TROMETHAMINE 15 MG/ML IJ SOLN: 15.0000 mg | Freq: Four times a day (QID) | INTRAMUSCULAR | Status: DC | PRN

## 2023-12-31 MED ORDER — OXYCODONE HCL 5 MG/5ML PO SOLN: 5.0000 mg | Freq: Once | ORAL | Status: AC | PRN

## 2023-12-31 MED ORDER — FENTANYL CITRATE (PF) 100 MCG/2ML IJ SOLN
INTRAMUSCULAR | Status: DC | PRN
  Administered 2023-12-31: 50 ug via INTRAVENOUS

## 2023-12-31 MED ORDER — OXYCODONE HCL 5 MG PO TABS
ORAL_TABLET | ORAL | Status: AC
  Filled 2023-12-31: qty 1

## 2023-12-31 MED ORDER — SENNOSIDES-DOCUSATE SODIUM 8.6-50 MG PO TABS
1.0000 | ORAL_TABLET | Freq: Two times a day (BID) | ORAL | Status: DC
  Administered 2023-12-31 – 2024-01-01 (×2): 1 via ORAL
  Filled 2023-12-31 (×3): qty 1

## 2023-12-31 MED ORDER — LIDOCAINE HCL (PF) 2 % IJ SOLN
INTRAMUSCULAR | Status: AC
  Filled 2023-12-31: qty 5

## 2023-12-31 MED ORDER — HYDROCODONE-ACETAMINOPHEN 5-325 MG PO TABS
1.0000 | ORAL_TABLET | Freq: Four times a day (QID) | ORAL | Status: DC | PRN
  Administered 2024-01-01: 2 via ORAL
  Filled 2023-12-31: qty 2

## 2023-12-31 MED ORDER — MIDAZOLAM HCL 2 MG/2ML IJ SOLN
INTRAMUSCULAR | Status: AC
  Filled 2023-12-31: qty 2

## 2023-12-31 MED ORDER — FENTANYL CITRATE (PF) 100 MCG/2ML IJ SOLN: 25.0000 ug | INTRAMUSCULAR | Status: DC | PRN

## 2023-12-31 MED ORDER — LIDOCAINE HCL (CARDIAC) PF 100 MG/5ML IV SOSY
PREFILLED_SYRINGE | INTRAVENOUS | Status: DC | PRN
Start: 2023-12-31 — End: 2023-12-31
  Administered 2023-12-31: 100 mg via INTRAVENOUS

## 2023-12-31 SURGICAL SUPPLY — 18 items
BAG DRAIN SIEMENS DORNER NS (MISCELLANEOUS) ×1 IMPLANT
BRUSH SCRUB EZ 4% CHG (MISCELLANEOUS) IMPLANT
CATH URETL OPEN 5X70 (CATHETERS) ×1 IMPLANT
GLOVE BIOGEL PI IND STRL 7.5 (GLOVE) ×1 IMPLANT
GOWN STRL REUS W/ TWL LRG LVL3 (GOWN DISPOSABLE) ×1 IMPLANT
GOWN STRL REUS W/ TWL XL LVL3 (GOWN DISPOSABLE) ×1 IMPLANT
GUIDEWIRE STR DUAL SENSOR (WIRE) ×1 IMPLANT
IV NS IRRIG 3000ML ARTHROMATIC (IV SOLUTION) ×1 IMPLANT
KIT TURNOVER CYSTO (KITS) ×1 IMPLANT
PACK CYSTO AR (MISCELLANEOUS) ×1 IMPLANT
SET CYSTO W/LG BORE CLAMP LF (SET/KITS/TRAYS/PACK) ×1 IMPLANT
STENT URET 6FRX24 CONTOUR (STENTS) IMPLANT
STENT URET 6FRX26 CONTOUR (STENTS) IMPLANT
SURGILUBE 2OZ TUBE FLIPTOP (MISCELLANEOUS) ×1 IMPLANT
SYR TOOMEY IRRIG 70ML (MISCELLANEOUS) IMPLANT
SYRINGE TOOMEY IRRIG 70ML (MISCELLANEOUS) IMPLANT
WATER STERILE IRR 1000ML POUR (IV SOLUTION) ×1 IMPLANT
WATER STERILE IRR 500ML POUR (IV SOLUTION) ×1 IMPLANT

## 2023-12-31 NOTE — Progress Notes (Signed)
 Anticoagulation monitoring(Lovenox):  53 yo female ordered Lovenox 40 mg Q24h    Filed Weights   12/31/23 0029  Weight: 93.9 kg (207 lb)   BMI 33.4    Lab Results  Component Value Date   CREATININE 0.88 12/30/2023   CREATININE 0.63 08/19/2020   CREATININE 0.53 07/05/2019   Estimated Creatinine Clearance: 85.3 mL/min (by C-G formula based on SCr of 0.88 mg/dL). Hemoglobin & Hematocrit     Component Value Date/Time   HGB 15.7 (H) 12/30/2023 1810   HGB 14.3 04/11/2013 0949   HCT 46.7 (H) 12/30/2023 1810   HCT 42.6 04/11/2013 0949     Per Protocol for Patient with estCrcl > 30 ml/min and BMI > 30, will transition to Lovenox 47.5 mg Q24h.

## 2023-12-31 NOTE — Anesthesia Procedure Notes (Signed)
 Procedure Name: Intubation Date/Time: 12/31/2023 12:55 AM  Performed by: Katherine Basset, CRNAPre-anesthesia Checklist: Patient identified, Emergency Drugs available, Suction available and Patient being monitored Patient Re-evaluated:Patient Re-evaluated prior to induction Oxygen Delivery Method: Circle system utilized Preoxygenation: Pre-oxygenation with 100% oxygen Induction Type: IV induction, Rapid sequence and Cricoid Pressure applied Laryngoscope Size: McGrath and 3 Grade View: Grade I Tube type: Oral Tube size: 6.5 mm Number of attempts: 1 Airway Equipment and Method: Stylet, Oral airway and Bite block Placement Confirmation: ETT inserted through vocal cords under direct vision, positive ETCO2 and breath sounds checked- equal and bilateral Secured at: 20 cm Tube secured with: Tape Dental Injury: Teeth and Oropharynx as per pre-operative assessment

## 2023-12-31 NOTE — Progress Notes (Signed)
 Urology Inpatient Progress Note  Subjective: No acute events overnight. She is afebrile, VSS. WBC count down, 13.7. Creatinine has returned to baseline, 0.67.  Lactate has normalized, 1.3.  Urine culture pending, blood cultures pending with no growth at <12 hours; on antibiotics as below. She is voiding spontaneously with some mild dysuria and gross hematuria, which has been improving throughout the day. Today she reports feeling much better than on arrival. She denies flank discomfort.  Anti-infectives: Anti-infectives (From admission, onward)    Start     Dose/Rate Route Frequency Ordered Stop   12/31/23 1030  levofloxacin (LEVAQUIN) IVPB 750 mg        750 mg 100 mL/hr over 90 Minutes Intravenous Every 24 hours 12/30/23 2249     12/30/23 2230  ciprofloxacin (CIPRO) IVPB 400 mg        400 mg 200 mL/hr over 60 Minutes Intravenous  Once 12/30/23 2222 12/30/23 2343       Current Facility-Administered Medications  Medication Dose Route Frequency Provider Last Rate Last Admin   acetaminophen (TYLENOL) tablet 650 mg  650 mg Oral Q6H PRN Sondra Come, MD   650 mg at 12/31/23 1051   Or   acetaminophen (TYLENOL) suppository 650 mg  650 mg Rectal Q6H PRN Sondra Come, MD       enoxaparin (LOVENOX) injection 47.5 mg  0.5 mg/kg Subcutaneous Q24H Legrand Rams C, MD   47.5 mg at 12/31/23 0931   ketorolac (TORADOL) 15 MG/ML injection 15 mg  15 mg Intravenous Q6H PRN Esaw Grandchild A, DO       lactated ringers infusion  150 mL/hr Intravenous Continuous Sondra Come, MD 150 mL/hr at 12/31/23 0046 Restarted at 12/31/23 0112   levofloxacin (LEVAQUIN) IVPB 750 mg  750 mg Intravenous Q24H Legrand Rams C, MD 100 mL/hr at 12/31/23 0949 750 mg at 12/31/23 0949   loratadine (CLARITIN) tablet 10 mg  10 mg Oral Daily Sondra Come, MD   10 mg at 12/31/23 1610   magnesium hydroxide (MILK OF MAGNESIA) suspension 30 mL  30 mL Oral Daily PRN Sondra Come, MD       menthol-cetylpyridinium  (CEPACOL) lozenge 3 mg  1 lozenge Oral PRN Esaw Grandchild A, DO   3 mg at 12/31/23 0949   morphine (PF) 2 MG/ML injection 2 mg  2 mg Intravenous Q4H PRN Sondra Come, MD       ondansetron [facility]) tablet 4 mg  4 mg Oral Q6H PRN Sondra Come, MD       Or   ondansetron [facility]) injection 4 mg  4 mg Intravenous Q6H PRN Sondra Come, MD       oxybutynin (DITROPAN-XL) 24 hr tablet 10 mg  10 mg Oral Daily PRN Sondra Come, MD       simethicone (MYLICON) chewable tablet 80 mg  80 mg Oral Q6H PRN Sondra Come, MD       traZODone (DESYREL) tablet 25 mg  25 mg Oral QHS PRN Sondra Come, MD        Objective: Vital signs in last 24 hours: Temp:  [97.3 F (36.3 C)-97.9 F (36.6 C)] 97.3 F (36.3 C) (02/25 0811) Pulse Rate:  [61-90] 76 (02/25 0811) Resp:  [10-24] 18 (02/25 0811) BP: (101-165)/(63-101) 115/74 (02/25 0811) SpO2:  [92 %-99 %] 92 % (02/25 0811) Weight:  [93.9 kg] 93.9 kg (02/25 0029)  Intake/Output from previous day: 02/24 0701 - 02/25 0700 In: 300 [I.V.:300] Out: -  Intake/Output this shift: No intake/output data recorded.  Physical Exam Vitals and nursing note reviewed.  Constitutional:      General: She is not in acute distress.    Appearance: She is not ill-appearing, toxic-appearing or diaphoretic.  HENT:     Head: Normocephalic and atraumatic.  Pulmonary:     Effort: Pulmonary effort is normal. No respiratory distress.  Skin:    General: Skin is warm and dry.  Neurological:     Mental Status: She is alert and oriented to person, place, and time.  Psychiatric:        Mood and Affect: Mood normal.        Behavior: Behavior normal.    Lab Results:  Recent Labs    12/30/23 1810 12/31/23 0612  WBC 16.1* 13.7*  HGB 15.7* 14.3  HCT 46.7* 41.7  PLT 387 398   BMET Recent Labs    12/30/23 1810 12/31/23 0612  NA 141 138  K 3.7 4.1  CL 110 109  CO2 16* 22  GLUCOSE 116* 156*  BUN 16 16  CREATININE 0.88 0.67  CALCIUM 9.6 9.0    PT/INR Recent Labs    12/31/23 0612  LABPROT 13.8  INR 1.0   Studies/Results: DG OR UROLOGY CYSTO IMAGE ([facility] ONLY) Result Date: 12/31/2023 There is no interpretation for this exam.  This order is for images obtained during a surgical procedure.  Please See "Surgeries" Tab for more information regarding the procedure.   CT Renal Stone Study Result Date: 12/30/2023 CLINICAL DATA:  Left flank pain. EXAM: CT ABDOMEN AND PELVIS WITHOUT CONTRAST TECHNIQUE: Multidetector CT imaging of the abdomen and pelvis was performed following the standard protocol without IV contrast. RADIATION DOSE REDUCTION: This exam was performed according to the departmental dose-optimization program which includes automated exposure control, adjustment of the mA and/or kV according to patient size and/or use of iterative reconstruction technique. COMPARISON:  None Available. FINDINGS: Lower chest: No acute abnormality. Hepatobiliary: No focal liver abnormality is seen. The gallbladder is not identified. No biliary dilatation. Pancreas: Unremarkable. No pancreatic ductal dilatation or surrounding inflammatory changes. Spleen: Normal in size without focal abnormality. Adrenals/Urinary Tract: Adrenal glands are unremarkable. Kidneys are normal in size, without focal lesions. 2 mm and 3 mm nonobstructing renal calculi are seen within the left kidney. A 2 mm obstructing renal calculus is seen within the proximal left ureter, with mild to moderate severity left-sided hydronephrosis, hydroureter and perinephric inflammatory fat stranding. Bladder is unremarkable. Stomach/Bowel: Stomach is within normal limits. The appendix is surgically absent. No evidence of bowel wall thickening, distention, or inflammatory changes. Vascular/Lymphatic: No significant vascular findings are present. No enlarged abdominal or pelvic lymph nodes. Reproductive: Uterus and bilateral adnexa are unremarkable. Other: No abdominal wall hernia or abnormality. No  abdominopelvic ascites. Musculoskeletal: No acute or significant osseous findings. IMPRESSION: 1. 2 mm obstructing renal calculus within the proximal left ureter. 2. Additional, nonobstructing left renal calculi. 3. Evidence of prior appendectomy. Electronically Signed   By: Aram Candela M.D.   On: 12/30/2023 19:42   Assessment & Plan: 53 y.o. female with PMH nephrolithiasis now s/p left ureteral stent placement with Dr. Richardo Hanks for management of sepsis due to an obstructing 5 mm proximal left ureteral stone.  She is clinically improving on empiric antibiotics.  She is tolerating her stent well with minimal dysuria.  We discussed that she will require 10-14 days of culture-appropriate antibiotics to treat her infection, followed by outpatient left ureteroscopy with laser lithotripsy and stent exchange  in 2-3 weeks to treat her stone. She expressed understanding.  Recommendations: -Continue empiric antibiotics and follow cultures for a total of 10-14 days of culture-appropriate therapy. -Consider Flomax 0.4mg  daily and oxybutynin 5mg  every 8 hours as needed for stent discomfort. -Outpatient left URS/LL/stent exchange with Dr. Richardo Hanks in 2-3 weeks; our scheduler will contact her after discharge to arrange  Carman Ching, PA-C 12/31/2023

## 2023-12-31 NOTE — Assessment & Plan Note (Signed)
-   This is manifested by leukocytosis and tachypnea. - The patient meets severe sepsis criteria based on elevated lactic acid. - Will place on IV Rocephin. - We will follow blood and urine cultures. - We will continue hydration with IV lactated ringer.

## 2023-12-31 NOTE — Progress Notes (Signed)
  Progress Note   Patient: Deborah Mclean [id number]:[id number] DOB: 01-10-1971 DOA: 12/30/2023     1 DOS: the patient was seen and examined on 12/31/2023   Brief hospital course: HPI on admission 12/30/2023: "53 y.o. Caucasian female with medical history significant for GERD, seasonal allergies and vertigo, who presented to the emergency room with acute onset of left flank pain that started today with associated nausea and vomiting.  She denied any fever or chills.  No dysuria, urinary frequency or urgency. ..." See H&P for full HPI on admission & ED course.  Pt was found to have an obstructing proximal left ureteral stone, and was admitted to the hospital with urology consulted.  Pt was taken to the OR overnight for cystoscopy and placement of a left ureteral stent.    Pt remains on empiric IV antibiotics pending urine culture results.    Assessment and Plan:  Acute unilateral obstructive uropathy Left pyelonephritis / Complicated UTI Sepsis due to gram-negative UTI  Associated with left hydroureter and hydronephrosis with perinephric stranding. Sepsis POA manifested by leukocytosis and tachypnea, organ dysfunction given elevated lactic acid. --Urology placed L ureteral stent 2/25  --Continue empiric Rocephin --Follow blood and urine cultures --Pain control and anti-emetics PRN --Ditropan daily PRN bladder spasms, urgency/frequency --Completed sepsis IV fluids    Hypertensive urgency Likely secondary to pain. --PRN IV hydralazine.   Seasonal allergies --on home antihistamine   GERD without esophagitis --Ccontinue PPI and H2 blocker   Obesity Body mass index is 33.41 kg/m. Complicates overall care and prognosis.  Recommend lifestyle modifications including physical activity and diet for weight loss and overall long-term health.       Subjective: Pt seen with s/o at bedside this AM.  L flank pain is better.  Has been having hematuria but it's fading, slowing down. No  fever/chills.  Does report a headache this AM. No other complaints.   Physical Exam: Vitals:   12/31/23 0215 12/31/23 0222 12/31/23 0342 12/31/23 0811  BP: 117/85  (!) 118/94 115/74  Pulse: 65 76 74 76  Resp: 12 18 20 18   Temp:   97.8 F (36.6 C) (!) 97.3 F (36.3 C)  TempSrc:   Oral Oral  SpO2: 93% 94% 97% 92%  Weight:      Height:       General exam: awake, alert, no acute distress HEENT: atraumatic, clear conjunctiva, anicteric sclera, moist mucus membranes, hearing grossly normal  Respiratory system: on room air, normal respiratory effort. Cardiovascular system: RRR, no pedal edema.   Gastrointestinal system: soft, NT, ND Central nervous system: A&O x4. no gross focal neurologic deficits, normal speech Extremities: moves all, no edema, normal tone Psychiatry: normal mood, congruent affect, judgement and insight appear normal   Data Reviewed:  Notable labs --   Glucose 156 otherwise normal BMP CBC with WBC improving 16.1 >> 13.7 Lactate normalized 3.2 >> 2.5 >> 1.3  Pending - blood and urine cultures   Family Communication: s/o at bedside on rounds  Disposition: Status is: Inpatient Remains inpatient appropriate because: on IV antibiotics pending cultures   Planned Discharge Destination: Home    Time spent: 42 minutes  Author: Pennie Banter, DO 12/31/2023 1:00 PM  For on call review [email or web address].

## 2023-12-31 NOTE — Anesthesia Postprocedure Evaluation (Signed)
 Anesthesia Post Note  Patient: Deborah Mclean  Procedure(s) Performed: CYSTOSCOPY WITH STENT PLACEMENT (Left)  Patient location during evaluation: PACU Anesthesia Type: General Level of consciousness: awake and alert Pain management: pain level controlled Vital Signs Assessment: post-procedure vital signs reviewed and stable Respiratory status: spontaneous breathing, nonlabored ventilation, respiratory function stable and patient connected to nasal cannula oxygen Cardiovascular status: blood pressure returned to baseline and stable Postop Assessment: no apparent nausea or vomiting Anesthetic complications: no   No notable events documented.   Last Vitals:  Vitals:   12/31/23 0222 12/31/23 0342  BP:  (!) 118/94  Pulse: 76 74  Resp: 18 20  Temp:  36.6 C  SpO2: 94% 97%    Last Pain:  Vitals:   12/31/23 0342  TempSrc: Oral  PainSc:                  Corinda Gubler

## 2023-12-31 NOTE — Assessment & Plan Note (Signed)
-   We will continue her antihistaminic therapy.

## 2023-12-31 NOTE — Op Note (Signed)
 Date of procedure: 12/31/23  Preoperative diagnosis:  Left ureteral stent UTI  Postoperative diagnosis:  Same  Procedure: Cystoscopy, left retrograde pyelogram with intraoperative interpretation, left ureteral stent placement  Surgeon: Legrand Rams, MD  Anesthesia: General  Complications: None  Intraoperative findings:  Normal bladder, uncomplicated left ureteral stent placement  EBL: None  Specimens: None  Drains: Left 6 French by 24 cm ureteral stent  Indication: Deborah Mclean is a 53 y.o. patient with 5 mm left proximal ureteral stone, concern for UTI with leukocytosis and elevated lactate who opted for stent placement with plan for delayed treatment of stone after infection treated.  After reviewing the management options for treatment, they elected to proceed with the above surgical procedure(s). We have discussed the potential benefits and risks of the procedure, side effects of the proposed treatment, the likelihood of the patient achieving the goals of the procedure, and any potential problems that might occur during the procedure or recuperation. Informed consent has been obtained.  Description of procedure:  The patient was taken to the operating room and general anesthesia was induced. SCDs were placed for DVT prophylaxis. The patient was placed in the dorsal lithotomy position, prepped and draped in the usual sterile fashion, and preoperative antibiotics(Cipro in ER) were administered. A preoperative time-out was performed.   A 21 French rigid cystoscope was used to intubate the urethra and thorough cystoscopy was performed.  The bladder was grossly normal.  A sensor wire was used to intubate the left ureteral orifice advanced up to the left kidney under fluoroscopic vision.  A 5 French access catheter was advanced over the wire into the left kidney and the wire removed.  There is a hydronephrotic drip of cloudy urine.  Retrograde pyelogram was performed showing  moderate hydronephrosis.  A sensor wire was replaced and a 6 [place] by 24 cm ureteral stent was uneventfully placed with a curl in the renal pelvis as well as under direct vision of the bladder.  Fluid drained through the side ports of the stent.  The bladder was emptied and this concluded our procedure.  Disposition: Stable to PACU  Plan: Continue antibiotics, return to hospitalist service Will coordinate outpatient ureteroscopy and laser lithotripsy in 2 to 3 weeks for definitive management of stone  Legrand Rams, MD

## 2023-12-31 NOTE — Plan of Care (Signed)

## 2023-12-31 NOTE — Assessment & Plan Note (Signed)
-   This is associated with left hydroureter and hydronephrosis with perinephric stranding. - It is in the setting of sepsis with UTI. - She will be admitted to a medical telemetry bed. - Pain management will be provided. - We will continue to pack therapy with IV Rocephin. - Urology consult was sought. - Dr. Richardo Hanks was notified and is aware about the patient.

## 2023-12-31 NOTE — Assessment & Plan Note (Signed)
-   We will continue PPI therapy as well as H2 blocker therapy ?

## 2023-12-31 NOTE — Transfer of Care (Signed)
 Immediate Anesthesia Transfer of Care Note  Patient: Deborah Mclean  Procedure(s) Performed: CYSTOSCOPY WITH STENT PLACEMENT (Left)  Patient Location: PACU  Anesthesia Type:General  Level of Consciousness: drowsy  Airway & Oxygen Therapy: Patient Spontanous Breathing and Patient connected to face mask oxygen  Post-op Assessment: Report given to RN, Post -op Vital signs reviewed and stable, and Patient moving all extremities  Post vital signs: Reviewed and stable  Last Vitals:  Vitals Value Taken Time  BP 104/70 12/31/23 0130  Temp 36.6 C 12/31/23 0120  Pulse 84 12/31/23 0132  Resp 11 12/31/23 0132  SpO2 99 % 12/31/23 0132  Vitals shown include unfiled device data.  Last Pain:  Vitals:   12/31/23 0120  TempSrc:   PainSc: Asleep         Complications: No notable events documented.

## 2023-12-31 NOTE — Progress Notes (Signed)
 Pharmacy Antibiotic Note  Deborah Mclean is a 53 y.o. female admitted on 12/30/2023 with UTI.  Pharmacy has been consulted for levaquin dosing.  Plan: Ciprofloxacin 400 mg IV X 1 given on 2/24 @ 2239.  Levaquin 500 mg IV Q24H ordered to start on 2/25 @ 1030.  Height: 5\' 6"  (167.6 cm) Weight: 93.9 kg (207 lb) IBW/kg (Calculated) : 59.3  Temp (24hrs), Avg:97.6 F (36.4 C), Min:97.6 F (36.4 C), Max:97.6 F (36.4 C)  Recent Labs  Lab 12/30/23 1810 12/30/23 2255  WBC 16.1*  --   CREATININE 0.88  --   LATICACIDVEN  --  3.2*    Estimated Creatinine Clearance: 85.3 mL/min (by C-G formula based on SCr of 0.88 mg/dL).    Allergies  Allergen Reactions   Amoxicillin-Pot Clavulanate Anaphylaxis    Has patient had a PCN reaction causing immediate rash, facial/tongue/throat swelling, SOB or lightheadedness with hypotension: Yes Has patient had a PCN reaction causing severe rash involving mucus membranes or skin necrosis: No Has patient had a PCN reaction that required hospitalization No Has patient had a PCN reaction occurring within the last 10 years: Yes If all of the above answers are "NO", then may proceed with Cephalosporin use.     Antimicrobials this admission:   >>    >>   Dose adjustments this admission:   Microbiology results:  BCx:   UCx:    Sputum:    MRSA PCR:   Thank you for allowing pharmacy to be a part of this patient's care.  Darral Rishel D 12/31/2023 1:02 AM

## 2023-12-31 NOTE — H&P (Addendum)
 Urology Consult   I have been asked to see the patient by Dr. Arville Care, for evaluation and management of left ureteral stone and UTI.  Chief Complaint: Left flank pain  HPI:  Deborah Mclean is a 53 y.o. female who presented with severe left-sided flank pain, as well as chills and nausea/vomiting.  Evaluation in the ER showed a 5 mm left proximal ureteral stone with hydronephrosis and stranding, leukocytosis to 16k, elevated lactate of 3.2.  Urinalysis concerning for infection.  She was afebrile, however tachypneic.  No prior stone surgeries.  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 /Procedure: BREAST LUMPECTOMY;  Surgeon: Kieth Brightly, MD;  Location: [facility] ORS;  Service: General;  Laterality: Right;   CESAREAN SECTION  2009, 2012    Allergies:  Allergies  Allergen Reactions   Amoxicillin-Pot Clavulanate Anaphylaxis    Has patient had a PCN reaction causing immediate rash, facial/tongue/throat swelling, SOB or lightheadedness with hypotension: Yes Has patient had a PCN reaction causing severe rash involving mucus membranes or skin necrosis: No Has patient had a PCN reaction that required hospitalization No Has patient had a PCN reaction occurring within the last 10 years: Yes If all of the above answers are "NO", then may proceed with Cephalosporin use.     Family History: Family History  Problem Relation Age of Onset   Hyperlipidemia Mother    Hypertension Father    Hyperlipidemia Father    Breast cancer Neg Hx     Social History:   reports that she has never smoked. She has never used smokeless tobacco. She reports that she does not drink alcohol and does not use drugs.  ROS: Negative aside from those stated in the HPI.  Physical Exam: BP (!) 151/91 (BP Location: Left Arm)   Pulse 64   Temp 97.6 F (36.4 C) (Oral)   Resp 19   LMP 12/24/2017 (Approximate)   SpO2 94%    Constitutional:  Alert and oriented, No acute distress. Cardiovascular: Regular rate and rhythm Respiratory: Clear to auscultation bilaterally  GI: Abdomen is soft, nontender, nondistended, no abdominal masses   Laboratory Data: reviewed in epic, see HPI  Pertinent Imaging: I have personally reviewed the CT scan showing a 5 mm left proximal ureteral stone with upstream hydronephrosis and stranding.  Assessment & Plan:   53 year old female with 5 mm left proximal ureteral stone with upstream hydronephrosis and stranding, urinalysis concerning for infection, elevated lactate of 3.2.  We discussed the need for drainage in the setting of an infected and obstructed system.  A ureteral stent is a small plastic tube that is placed cystoscopically with one end in the kidney and the other end in the bladder that allows the infection from the kidney to drain, and relieves pain from the obstructing stone.  We discussed the risks at length including bleeding, infection, sepsis, death, ureteral injury, and stent related symptoms including urgency/frequency/dysuria/flank pain/gross hematuria.  There is a low, but not 0, risk of inability to pass the ureteral stent alongside the stone from  below which would require percutaneous nephrostomy tube by interventional radiology.  Finally, we discussed possible prolonged hospitalization and recovery, possible temporary Foley catheter placement, and 10 to 14-day course of antibiotics.  We reviewed the need for a follow-up procedure for definitive management of their stone when the infection has been treated in 2 to 3  weeks with ureteroscopy/laser lithotripsy.   Recommendations:  -Continue antibiotics and hospitalist admission -Cystoscopy and left ureteral stent placement tonight with plan for op delayed stone treatment in 2 to 3 weeks after infection treated  Sondra Come, MD   [facility] [address], [address] [place], [place] [address] [phone]

## 2023-12-31 NOTE — TOC CM/SW Note (Addendum)
 Transition of Care [facility]) - Inpatient Brief Assessment   Patient Details  Name: LUCIENNE SAWYERS MRN: [id number] Date of Birth: 05/11/1971  Transition of Care [facility]) CM/SW Contact:    Chapman Fitch, RN Phone Number: 12/31/2023, 11:34 AM   Clinical Narrative:   Transition of Care [facility]) Screening Note   Patient Details  Name: ADLEIGH MCMASTERS Date of Birth: 1971/07/13   Transition of Care [facility] CM/SW Contact:    Chapman Fitch, RN Phone Number: 12/31/2023, 11:34 AM    Transition of Care Department [facility]) has reviewed patient and no TOC needs have been identified at this time.  If new patient transition needs arise, please place a TOC consult.  No PCP on file. List of local PCP added to AVS  Transition of Care Asessment: Insurance and Status: Insurance coverage has been reviewed Patient has primary care physician: Yes     Prior/Current Home Services: No current home services Social Drivers of Health Review: SDOH reviewed no interventions necessary Readmission risk has been reviewed: Yes Transition of care needs: no transition of care needs at this time

## 2023-12-31 NOTE — Assessment & Plan Note (Signed)
-   This is likely secondary to pain. -We will place on as needed IV hydralazine.

## 2023-12-31 NOTE — Anesthesia Preprocedure Evaluation (Signed)
 Anesthesia Evaluation  Patient identified by MRN, date of birth, ID band Patient awake  General Assessment Comment:  Infected kidney stone.  Reviewed: Allergy & Precautions, NPO status , Patient's Chart, lab work & pertinent test results  History of Anesthesia Complications (+) PONV and history of anesthetic complications  Airway Mallampati: II  TM Distance: >3 FB Neck ROM: Full    Dental no notable dental hx. (+) Teeth Intact   Pulmonary asthma , neg sleep apnea, neg COPD, Patient abstained from smoking.Not current smoker Hx of "bronchitis". Mild cough.   Pulmonary exam normal breath sounds clear to auscultation       Cardiovascular Exercise Tolerance: Good METS(-) hypertension(-) CAD and (-) Past MI negative cardio ROS (-) dysrhythmias  Rhythm:Regular Rate:Normal - Systolic murmurs    Neuro/Psych negative neurological ROS  negative psych ROS   GI/Hepatic ,GERD  ,,(+)     (-) substance abuse    Endo/Other  neg diabetes    Renal/GU Renal disease     Musculoskeletal   Abdominal  (+) + obese  Peds  Hematology   Anesthesia Other Findings Past Medical History: No date: GERD (gastroesophageal reflux disease) No date: History of kidney stones     Comment:  2006, 2012 No date: PONV (postoperative nausea and vomiting)  Reproductive/Obstetrics                             Anesthesia Physical Anesthesia Plan  ASA: 2 and emergent  Anesthesia Plan: General   Post-op Pain Management:    Induction: Intravenous and Rapid sequence  PONV Risk Score and Plan: 4 or greater and Ondansetron, Dexamethasone, Midazolam, TIVA and Propofol infusion  Airway Management Planned: Video Laryngoscope Planned and Oral ETT  Additional Equipment: None  Intra-op Plan:   Post-operative Plan: Extubation in OR  Informed Consent: I have reviewed the patients History and Physical, chart, labs and discussed  the procedure including the risks, benefits and alternatives for the proposed anesthesia with the patient or authorized representative who has indicated his/her understanding and acceptance.     Dental advisory given  Plan Discussed with: CRNA and Surgeon  Anesthesia Plan Comments: (Patient actively vomiting in front of me. Plan for GETA /RSI Discussed risks of anesthesia with patient, including PONV, sore throat, lip/dental/eye damage, aspiration. Rare risks discussed as well, such as cardiorespiratory and neurological sequelae, and allergic reactions. Discussed the role of CRNA in patient's perioperative care. Patient understands.)       Anesthesia Quick Evaluation

## 2023-12-31 NOTE — Progress Notes (Unsigned)
 Surgical Physician Order Form [facility]  Dr. Legrand Rams, MD  * Scheduling expectation : ~2 weeks  *Length of Case: 1 hour  *Clearance needed: no  *Anticoagulation Instructions: May continue anticoagulants  *Aspirin Instructions: Ok to continue Aspirin  *Post-op visit Date/Instructions:  tbd  *Diagnosis: Left Ureteral Stone  *Procedure: left  Ureteroscopy w/laser lithotripsy & stent exchange (40981)   Additional orders: N/A  -Admit type: OUTpatient  -Anesthesia: General  -VTE Prophylaxis Standing Order SCD's       Other:   -Standing Lab Orders Per Anesthesia    Lab other: UA&Urine Culture  -Standing Test orders EKG/Chest x-ray per Anesthesia       Test other:   - Medications:  Cipro 400mg  IV  -Other orders:  N/A

## 2024-01-01 ENCOUNTER — Encounter: Payer: Self-pay | Admitting: Urology

## 2024-01-01 DIAGNOSIS — N139 Obstructive and reflux uropathy, unspecified: Secondary | ICD-10-CM | POA: Diagnosis not present

## 2024-01-01 LAB — URINE CULTURE

## 2024-01-01 LAB — CBC
HCT: 35.7 % — ABNORMAL LOW (ref 36.0–46.0)
Hemoglobin: 12.5 g/dL (ref 12.0–15.0)
MCH: 29.9 pg (ref 26.0–34.0)
MCHC: 35 g/dL (ref 30.0–36.0)
MCV: 85.4 fL (ref 80.0–100.0)
Platelets: 328 10*3/uL (ref 150–400)
RBC: 4.18 MIL/uL (ref 3.87–5.11)
RDW: 12.8 % (ref 11.5–15.5)
WBC: 10.1 10*3/uL (ref 4.0–10.5)
nRBC: 0 % (ref 0.0–0.2)

## 2024-01-01 MED ORDER — OXYBUTYNIN CHLORIDE ER 10 MG PO TB24
10.0000 mg | ORAL_TABLET | Freq: Every day | ORAL | Status: DC
  Administered 2024-01-01: 10 mg via ORAL
  Filled 2024-01-01: qty 1

## 2024-01-01 MED ORDER — LEVOFLOXACIN 750 MG PO TABS: 750.0000 mg | ORAL_TABLET | Freq: Every day | ORAL | 0 refills | Status: AC

## 2024-01-01 MED ORDER — ACETAMINOPHEN 325 MG PO TABS: 650.0000 mg | ORAL_TABLET | Freq: Four times a day (QID) | ORAL | 0 refills | Status: AC | PRN

## 2024-01-01 MED ORDER — SENNOSIDES-DOCUSATE SODIUM 8.6-50 MG PO TABS: 1.0000 | ORAL_TABLET | Freq: Two times a day (BID) | ORAL | 0 refills | Status: AC

## 2024-01-01 MED ORDER — OXYBUTYNIN CHLORIDE ER 10 MG PO TB24: 10.0000 mg | ORAL_TABLET | Freq: Every day | ORAL | 0 refills | Status: AC

## 2024-01-01 NOTE — Discharge Summary (Signed)
 Physician Discharge Summary   Patient: Deborah Mclean MRN: [id number] DOB: January 18, 1971  Admit date:     12/30/2023  Discharge date: 01/01/24  Discharge Physician: Loyce Dys   PCP: Patient, No Pcp Per   Recommendations at discharge:  Follow-up with urology  Discharge Diagnoses: Acute unilateral obstructive uropathy Left pyelonephritis / Complicated UTI Sepsis due to gram-negative UTI  Hypertensive urgency Seasonal allergies GERD without esophagitis Obesity  Hospital Course: 53 y.o. Caucasian female with medical history significant for GERD, seasonal allergies and vertigo, who presented to the emergency room with acute onset of left flank pain that started today with associated nausea and vomiting.  She denied any fever or chills.  Was found to have obstructive uropathy and taken to the OR by urologist.  She underwent cystoscopy, left retrograde pyelogram with intraoperative interpretation, left ureteral stent placement.  We discussed the option of waiting until culture results comes back however patient requested to be discharged now on current antibiotics and in case culture results shows something resistant to call to inform her.  Patient will follow-up with urologist.  Assessment and Plan: Acute unilateral obstructive uropathy Left pyelonephritis / Complicated UTI Sepsis due to gram-negative UTI  Hypertensive urgency Seasonal allergies GERD without esophagitis Obesity   Consultants: Urology Procedures performed: As mentioned above Disposition: Home Diet recommendation:  Cardiac diet DISCHARGE MEDICATION: Allergies as of 01/01/2024       Reactions   Amoxicillin-pot Clavulanate Anaphylaxis   Has patient had a PCN reaction causing immediate rash, facial/tongue/throat swelling, SOB or lightheadedness with hypotension: Yes Has patient had a PCN reaction causing severe rash involving mucus membranes or skin necrosis: No Has patient had a PCN reaction that required  hospitalization No Has patient had a PCN reaction occurring within the last 10 years: Yes If all of the above answers are "NO", then may proceed with Cephalosporin use.        Medication List     STOP taking these medications    multivitamin capsule   Vitamin D (Ergocalciferol) 1.25 MG (50000 UNIT) Caps capsule Commonly known as: DRISDOL       TAKE these medications    acetaminophen 325 MG tablet Commonly known as: TYLENOL Take 2 tablets (650 mg total) by mouth every 6 (six) hours as needed for mild pain (pain score 1-3) (or Fever >/= 101).   cetirizine 10 MG tablet Commonly known as: ZYRTEC Take 1 tablet (10 mg total) by mouth daily.   famotidine 20 MG tablet Commonly known as: PEPCID Take 1 tablet (20 mg total) by mouth 2 (two) times daily.   levofloxacin 750 MG tablet Commonly known as: Levaquin Take 1 tablet (750 mg total) by mouth daily for 10 days.   levonorgestrel 20 MCG/24HR IUD Commonly known as: MIRENA 1 each by Intrauterine route once.   oxybutynin 10 MG 24 hr tablet Commonly known as: DITROPAN-XL Take 1 tablet (10 mg total) by mouth daily. Start taking on: January 02, 2024   pantoprazole 20 MG tablet Commonly known as: PROTONIX Take 1 tablet (20 mg total) by mouth daily.   senna-docusate 8.6-50 MG tablet Commonly known as: Senokot-S Take 1 tablet by mouth 2 (two) times daily.   SIMETHICONE PO Take by mouth.        Discharge Exam: Filed Weights   12/31/23 0029  Weight: 93.9 kg   General exam: awake, alert, no acute distress HEENT: atraumatic, clear conjunctiva, anicteric sclera, moist mucus membranes, hearing grossly normal  Respiratory system: on room air, normal  respiratory effort. Cardiovascular system: RRR, no pedal edema.   Gastrointestinal system: soft, NT, ND Central nervous system: A&O x4. no gross focal neurologic deficits, normal speech Extremities: moves all, no edema, normal tone Psychiatry: normal mood, congruent  affect, judgement and insight appear normal  Condition at discharge: good  The results of significant diagnostics from this hospitalization (including imaging, microbiology, ancillary and laboratory) are listed below for reference.   Imaging Studies: DG OR UROLOGY CYSTO IMAGE ([facility] ONLY) Result Date: 12/31/2023 There is no interpretation for this exam.  This order is for images obtained during a surgical procedure.  Please See "Surgeries" Tab for more information regarding the procedure.   CT Renal Stone Study Result Date: 12/30/2023 CLINICAL DATA:  Left flank pain. EXAM: CT ABDOMEN AND PELVIS WITHOUT CONTRAST TECHNIQUE: Multidetector CT imaging of the abdomen and pelvis was performed following the standard protocol without IV contrast. RADIATION DOSE REDUCTION: This exam was performed according to the departmental dose-optimization program which includes automated exposure control, adjustment of the mA and/or kV according to patient size and/or use of iterative reconstruction technique. COMPARISON:  None Available. FINDINGS: Lower chest: No acute abnormality. Hepatobiliary: No focal liver abnormality is seen. The gallbladder is not identified. No biliary dilatation. Pancreas: Unremarkable. No pancreatic ductal dilatation or surrounding inflammatory changes. Spleen: Normal in size without focal abnormality. Adrenals/Urinary Tract: Adrenal glands are unremarkable. Kidneys are normal in size, without focal lesions. 2 mm and 3 mm nonobstructing renal calculi are seen within the left kidney. A 2 mm obstructing renal calculus is seen within the proximal left ureter, with mild to moderate severity left-sided hydronephrosis, hydroureter and perinephric inflammatory fat stranding. Bladder is unremarkable. Stomach/Bowel: Stomach is within normal limits. The appendix is surgically absent. No evidence of bowel wall thickening, distention, or inflammatory changes. Vascular/Lymphatic: No significant vascular findings  are present. No enlarged abdominal or pelvic lymph nodes. Reproductive: Uterus and bilateral adnexa are unremarkable. Other: No abdominal wall hernia or abnormality. No abdominopelvic ascites. Musculoskeletal: No acute or significant osseous findings. IMPRESSION: 1. 2 mm obstructing renal calculus within the proximal left ureter. 2. Additional, nonobstructing left renal calculi. 3. Evidence of prior appendectomy. Electronically Signed   By: Aram Candela M.D.   On: 12/30/2023 19:42    Microbiology: Results for orders placed or performed during the hospital encounter of 12/30/23  Urine Culture     Status: Abnormal   Collection Time: 12/30/23  4:33 PM   Specimen: Urine, Random  Result Value Ref Range Status   Specimen Description   Final    URINE, RANDOM Performed at [facility], [address] [place], [place] [address]    Special Requests   Final    NONE Performed at [facility], [address] [place], [place] [address]    Culture MULTIPLE SPECIES PRESENT, SUGGEST RECOLLECTION (A)  Final   Report Status 01/01/2024 FINAL  Final  Blood culture (routine x 2)     Status: None (Preliminary result)   Collection Time: 12/30/23  5:51 PM   Specimen: BLOOD  Result Value Ref Range Status   Specimen Description BLOOD LEFT HAND  Final   Special Requests   Final    BOTTLES DRAWN AEROBIC AND ANAEROBIC Blood Culture results may not be optimal due to an inadequate volume of blood received in culture bottles   Culture   Final    NO GROWTH 2 DAYS Performed at [facility], [address], [place], [place] [address]    Report Status PENDING  Incomplete  Blood culture (routine x 2)     Status: None (Preliminary result)   Collection Time: 12/30/23  5:51 PM   Specimen: BLOOD  Result Value Ref Range Status   Specimen Description BLOOD RIGHT HAND  Final   Special Requests   Final    BOTTLES DRAWN AEROBIC AND ANAEROBIC Blood Culture results may not be optimal due to  an inadequate volume of blood received in culture bottles   Culture   Final    NO GROWTH 2 DAYS Performed at [facility], [address] [place], [place] [address]    Report Status PENDING  Incomplete    Labs: CBC: Recent Labs  Lab 12/30/23 1810 12/31/23 0612 01/01/24 0426  WBC 16.1* 13.7* 10.1  HGB 15.7* 14.3 12.5  HCT 46.7* 41.7 35.7*  MCV 90.7 86.9 85.4  PLT 387 398 328   Basic Metabolic Panel: Recent Labs  Lab 12/30/23 1810 12/31/23 0612  NA 141 138  K 3.7 4.1  CL 110 109  CO2 16* 22  GLUCOSE 116* 156*  BUN 16 16  CREATININE 0.88 0.67  CALCIUM 9.6 9.0   Liver Function Tests: Recent Labs  Lab 12/30/23 1810  AST 48*  ALT 74*  ALKPHOS 96  BILITOT 0.6  PROT 7.3  ALBUMIN 4.3   CBG: No results for input(s): "GLUCAP" in the last 168 hours.  Discharge time spent: greater than 30 minutes.  Signed: Loyce Dys, MD [facility] 01/01/2024

## 2024-01-01 NOTE — Plan of Care (Signed)
  Problem: Pain Managment: Goal: General experience of comfort will improve and/or be controlled Outcome: Progressing   Problem: Safety: Goal: Ability to remain free from injury will improve Outcome: Progressing   Problem: Skin Integrity: Goal: Risk for impaired skin integrity will decrease Outcome: Progressing

## 2024-01-01 NOTE — Progress Notes (Signed)
 AVS given and reviewed with pt and husband at bedside. Medications discussed. All questions answered to satisfaction. Pt and husband verbalized understanding of information given. Pt escorted off the unit with all belongings via wheelchair by volunteer services.

## 2024-01-01 NOTE — Plan of Care (Signed)
  Problem: Fluid Volume: Goal: Hemodynamic stability will improve Outcome: Progressing   Problem: Clinical Measurements: Goal: Diagnostic test results will improve Outcome: Progressing Goal: Signs and symptoms of infection will decrease Outcome: Progressing   Problem: Respiratory: Goal: Ability to maintain adequate ventilation will improve Outcome: Progressing   Problem: Education: Goal: Knowledge of General Education information will improve Description: Including pain rating scale, medication(s)/side effects and non-pharmacologic comfort measures Outcome: Progressing   Problem: Health Behavior/Discharge Planning: Goal: Ability to manage health-related needs will improve Outcome: Progressing   Problem: Clinical Measurements: Goal: Ability to maintain clinical measurements within normal limits will improve Outcome: Progressing Goal: Will remain free from infection Outcome: Progressing Goal: Diagnostic test results will improve Outcome: Progressing Goal: Respiratory complications will improve Outcome: Progressing Goal: Cardiovascular complication will be avoided Outcome: Progressing   Problem: Nutrition: Goal: Adequate nutrition will be maintained Outcome: Progressing   Problem: Coping: Goal: Level of anxiety will decrease Outcome: Progressing   Problem: Elimination: Goal: Will not experience complications related to bowel motility Outcome: Progressing Goal: Will not experience complications related to urinary retention Outcome: Progressing   Problem: Pain Managment: Goal: General experience of comfort will improve and/or be controlled Outcome: Progressing   Problem: Safety: Goal: Ability to remain free from injury will improve Outcome: Progressing   Problem: Skin Integrity: Goal: Risk for impaired skin integrity will decrease Outcome: Progressing

## 2024-01-02 ENCOUNTER — Telehealth: Payer: Self-pay

## 2024-01-02 MED ORDER — TAMSULOSIN HCL 0.4 MG PO CAPS: 0.4000 mg | ORAL_CAPSULE | Freq: Every day | ORAL | 0 refills | Status: AC

## 2024-01-02 MED ORDER — TAMSULOSIN HCL 0.4 MG PO CAPS: 0.4000 mg | ORAL_CAPSULE | Freq: Every day | ORAL | 0 refills | Status: DC

## 2024-01-02 NOTE — Addendum Note (Signed)
 Addended by: Letta Kocher A on: 01/02/2024 11:50 AM   Modules accepted: Orders

## 2024-01-02 NOTE — Telephone Encounter (Signed)
  Per Dr. Richardo Hanks, Patient is to be scheduled for  Left Ureteroscopy with Laser Lithotripsy and Stent Exchange   Deborah Mclean was contacted and possible surgical dates were discussed, Friday March 14th, 2025 was agreed upon for surgery.   Patient was directed to call [phone] between 1-3pm the day before surgery to find out surgical arrival time.  Instructions were given not to eat or drink from midnight on the night before surgery and have a driver for the day of surgery. On the surgery day patient was instructed to enter through the Medical Mall entrance of [facility] report the Same Day Surgery desk.   Pre-Admit Testing will be in contact via phone to set up an interview with the anesthesia team to review your history and medications prior to surgery.   Reminder of this information was sent via MyChart to the patient.    Patient also reported that she has been having urinary frequency, urgency and painful urination. Spoke with PA Sam and advised to send her in Flomax to help combat these symptoms. Patient encouraged to call with any new or worsening symptoms.

## 2024-01-02 NOTE — Progress Notes (Signed)
   [facility] Surgical Posting Form  Surgery Date: Date: 01/17/2024  Surgeon: Dr. Legrand Rams, MD  Inpt ( No  )   Outpt (Yes)   Obs ( No  )   Diagnosis: N20.1 Left Ureteral Stones  -CPT: [phone]  Surgery: Left Ureteroscopy with Laser Lithotripsy and Stent Exchange  Stop Anticoagulations: No, may continue all  Cardiac/Medical/Pulmonary Clearance needed: no  *Orders entered into EPIC  Date: 01/02/24   *Case booked in [place]  Date: 01/02/24  *Notified pt of Surgery: Date: 01/02/24  PRE-OP UA & CX: Obtained on 12/30/2023  *Placed into Prior Authorization Work Angela Nevin Date: 01/02/24  Assistant/laser/rep:No

## 2024-01-04 LAB — CULTURE, BLOOD (ROUTINE X 2)
Culture: NO GROWTH
Culture: NO GROWTH

## 2024-01-06 ENCOUNTER — Telehealth: Payer: Self-pay | Admitting: *Deleted

## 2024-01-06 NOTE — Telephone Encounter (Signed)
 Patient called after hours line. Called patient and she was asking about what are the side efforts for a stent beginning in . Advised burning may happen with urination. Blood in the urine. Uncomforted feeing in the low abdominal area.  Make  sure she  have no fever or chills. She will call back with any questions.

## 2024-01-10 ENCOUNTER — Other Ambulatory Visit: Payer: Self-pay

## 2024-01-10 ENCOUNTER — Encounter
Admission: RE | Admit: 2024-01-10 | Discharge: 2024-01-10 | Disposition: A | Discharge status: Home / Self Care | Payer: BLUE CROSS/BLUE SHIELD | Source: Ambulatory Visit | Attending: Urology | Admitting: Urology

## 2024-01-10 HISTORY — DX: Gram-negative sepsis, unspecified: A41.50

## 2024-01-10 NOTE — Patient Instructions (Signed)
 Your procedure is scheduled on:01-17-24 Friday Report to the Registration Desk on the 1st floor of the Medical Mall.Then proceed to the 2nd floor Surgery Desk To find out your arrival time, please call [phone] between 1PM - 3PM on:01-16-24 Thursday If your arrival time is 6:00 am, do not arrive before that time as the Medical Mall entrance doors do not open until 6:00 am.  REMEMBER: Instructions that are not followed completely may result in serious medical risk, up to and including death; or upon the discretion of your surgeon and anesthesiologist your surgery may need to be rescheduled.  Do not eat food OR drink any liquids after midnight the night before surgery.  No gum chewing or hard candies.  One week prior to surgery:Stop NOW (01-10-24) Stop Anti-inflammatories (NSAIDS) such as Advil, Aleve, Ibuprofen, Motrin, Naproxen, Naprosyn and Aspirin based products such as Excedrin, Goody's Powder, BC Powder. Stop ANY OVER THE COUNTER supplements until after surgery.  You may however, continue to take Tylenol if needed for pain up until the day of surgery.  Continue taking all of your other prescription medications up until the day of surgery.  ON THE DAY OF SURGERY ONLY TAKE THESE MEDICATIONS WITH SIPS OF WATER: -cetirizine (ZYRTEC)   No Alcohol for 24 hours before or after surgery.  No Smoking including e-cigarettes for 24 hours before surgery.  No chewable tobacco products for at least 6 hours before surgery.  No nicotine patches on the day of surgery.  Do not use any "recreational" drugs for at least a week (preferably 2 weeks) before your surgery.  Please be advised that the combination of cocaine and anesthesia may have negative outcomes, up to and including death. If you test positive for cocaine, your surgery will be cancelled.  On the morning of surgery brush your teeth with toothpaste and water, you may rinse your mouth with mouthwash if you wish. Do not swallow any  toothpaste or mouthwash.  Do not wear jewelry, make-up, hairpins, clips or nail polish.  For welded (permanent) jewelry: bracelets, anklets, waist bands, etc.  Please have this removed prior to surgery.  If it is not removed, there is a chance that hospital personnel will need to cut it off on the day of surgery.  Do not wear lotions, powders, or perfumes.   Do not shave body hair from the neck down 48 hours before surgery.  Contact lenses, hearing aids and dentures may not be worn into surgery.  Do not bring valuables to the hospital. [facility] is not responsible for any missing/lost belongings or valuables.    Notify your doctor if there is any change in your medical condition (cold, fever, infection).  Wear comfortable clothing (specific to your surgery type) to the hospital.  After surgery, you can help prevent lung complications by doing breathing exercises.  Take deep breaths and cough every 1-2 hours. Your doctor may order a device called an Incentive Spirometer to help you take deep breaths. When coughing or sneezing, hold a pillow firmly against your incision with both hands. This is called "splinting." Doing this helps protect your incision. It also decreases belly discomfort.  If you are being admitted to the hospital overnight, leave your suitcase in the car. After surgery it may be brought to your room.  In case of increased patient census, it may be necessary for you, the patient, to continue your postoperative care in the Same Day Surgery department.  If you are being discharged the day of surgery,  you will not be allowed to drive home. You will need a responsible individual to drive you home and stay with you for 24 hours after surgery.   If you are taking public transportation, you will need to have a responsible individual with you.  Please call the Pre-admissions Testing Dept. at [phone] if you have any questions about these instructions.  Surgery  Visitation Policy:  Patients having surgery or a procedure may have two visitors.  Children under the age of 76 must have an adult with them who is not the patient.  Temporary Visitor Restrictions Due to increasing cases of flu, RSV and COVID-19: Children ages 50 and under will not be able to visit patients in [facility] hospitals under most circumstances.

## 2024-01-16 MED ORDER — LACTATED RINGERS IV SOLN: INTRAVENOUS | Status: DC

## 2024-01-16 MED ORDER — CIPROFLOXACIN IN D5W 400 MG/200ML IV SOLN
400.0000 mg | INTRAVENOUS | Status: AC
  Administered 2024-01-17: 400 mg via INTRAVENOUS

## 2024-01-16 MED ORDER — ORAL CARE MOUTH RINSE: 15.0000 mL | Freq: Once | OROMUCOSAL | Status: AC

## 2024-01-16 MED ORDER — CHLORHEXIDINE GLUCONATE 0.12 % MT SOLN
15.0000 mL | Freq: Once | OROMUCOSAL | Status: AC
  Administered 2024-01-17: 15 mL via OROMUCOSAL

## 2024-01-17 ENCOUNTER — Ambulatory Visit

## 2024-01-17 ENCOUNTER — Other Ambulatory Visit: Payer: Self-pay

## 2024-01-17 ENCOUNTER — Encounter
Admission: RE | Disposition: A | Discharge status: Home / Self Care | Payer: Self-pay | Source: Home / Self Care | Attending: Urology

## 2024-01-17 ENCOUNTER — Ambulatory Visit
Admission: RE | Admit: 2024-01-17 | Discharge: 2024-01-17 | Disposition: A | Discharge status: Home / Self Care | Payer: BLUE CROSS/BLUE SHIELD | Attending: Urology | Admitting: Urology

## 2024-01-17 ENCOUNTER — Encounter: Payer: Self-pay | Admitting: Urology

## 2024-01-17 ENCOUNTER — Ambulatory Visit: Payer: Self-pay | Admitting: Urgent Care

## 2024-01-17 ENCOUNTER — Ambulatory Visit: Admitting: General Practice

## 2024-01-17 DIAGNOSIS — N202 Calculus of kidney with calculus of ureter: Secondary | ICD-10-CM | POA: Insufficient documentation

## 2024-01-17 DIAGNOSIS — N201 Calculus of ureter: Secondary | ICD-10-CM

## 2024-01-17 HISTORY — PX: CYSTOSCOPY W/ RETROGRADES: SHX1426

## 2024-01-17 HISTORY — PX: CYSTOSCOPY/URETEROSCOPY/HOLMIUM LASER/STENT PLACEMENT: SHX6546

## 2024-01-17 SURGERY — CYSTOSCOPY/URETEROSCOPY/HOLMIUM LASER/STENT PLACEMENT
Anesthesia: General | Site: Ureter | Laterality: Left

## 2024-01-17 MED ORDER — MIDAZOLAM HCL 2 MG/2ML IJ SOLN
INTRAMUSCULAR | Status: AC
  Filled 2024-01-17: qty 2

## 2024-01-17 MED ORDER — FENTANYL CITRATE (PF) 100 MCG/2ML IJ SOLN: 25.0000 ug | INTRAMUSCULAR | Status: DC | PRN

## 2024-01-17 MED ORDER — DEXAMETHASONE SODIUM PHOSPHATE 10 MG/ML IJ SOLN
INTRAMUSCULAR | Status: DC | PRN
  Administered 2024-01-17: 10 mg via INTRAVENOUS

## 2024-01-17 MED ORDER — PROPOFOL 1000 MG/100ML IV EMUL
INTRAVENOUS | Status: AC
  Filled 2024-01-17: qty 100

## 2024-01-17 MED ORDER — OXYCODONE HCL 5 MG PO TABS: 5.0000 mg | ORAL_TABLET | Freq: Once | ORAL | Status: DC | PRN

## 2024-01-17 MED ORDER — SULFAMETHOXAZOLE-TRIMETHOPRIM 800-160 MG PO TABS
1.0000 | ORAL_TABLET | Freq: Once | ORAL | 0 refills | Status: AC | PRN
Start: 2024-01-17 — End: ?

## 2024-01-17 MED ORDER — CIPROFLOXACIN IN D5W 400 MG/200ML IV SOLN
INTRAVENOUS | Status: AC
  Filled 2024-01-17: qty 200

## 2024-01-17 MED ORDER — TRAMADOL HCL 50 MG PO TABS: 25.0000 mg | ORAL_TABLET | Freq: Four times a day (QID) | ORAL | 0 refills | Status: AC | PRN

## 2024-01-17 MED ORDER — FENTANYL CITRATE (PF) 100 MCG/2ML IJ SOLN
INTRAMUSCULAR | Status: AC
Start: 2024-01-17 — End: ?
  Filled 2024-01-17: qty 2

## 2024-01-17 MED ORDER — MIDAZOLAM HCL 5 MG/5ML IJ SOLN
INTRAMUSCULAR | Status: DC | PRN
  Administered 2024-01-17: 2 mg via INTRAVENOUS

## 2024-01-17 MED ORDER — ROCURONIUM BROMIDE 100 MG/10ML IV SOLN
INTRAVENOUS | Status: DC | PRN
  Administered 2024-01-17: 50 mg via INTRAVENOUS

## 2024-01-17 MED ORDER — OXYCODONE HCL 5 MG/5ML PO SOLN: 5.0000 mg | Freq: Once | ORAL | Status: DC | PRN

## 2024-01-17 MED ORDER — KETOROLAC TROMETHAMINE 30 MG/ML IJ SOLN
INTRAMUSCULAR | Status: DC | PRN
  Administered 2024-01-17: 15 mg via INTRAVENOUS

## 2024-01-17 MED ORDER — LIDOCAINE HCL (CARDIAC) PF 100 MG/5ML IV SOSY
PREFILLED_SYRINGE | INTRAVENOUS | Status: DC | PRN
Start: 2024-01-17 — End: 2024-01-17
  Administered 2024-01-17: 100 mg via INTRAVENOUS

## 2024-01-17 MED ORDER — IOHEXOL 180 MG/ML  SOLN
INTRAMUSCULAR | Status: DC | PRN
  Administered 2024-01-17: 10 mL

## 2024-01-17 MED ORDER — SODIUM CHLORIDE 0.9 % IR SOLN
Status: DC | PRN
  Administered 2024-01-17: 3000 mL

## 2024-01-17 MED ORDER — PROPOFOL 10 MG/ML IV BOLUS
INTRAVENOUS | Status: DC | PRN
  Administered 2024-01-17: 200 mg via INTRAVENOUS

## 2024-01-17 MED ORDER — CHLORHEXIDINE GLUCONATE 0.12 % MT SOLN
OROMUCOSAL | Status: AC
  Filled 2024-01-17: qty 15

## 2024-01-17 MED ORDER — FENTANYL CITRATE (PF) 100 MCG/2ML IJ SOLN
INTRAMUSCULAR | Status: DC | PRN
  Administered 2024-01-17: 100 ug via INTRAVENOUS

## 2024-01-17 MED ORDER — PROPOFOL 500 MG/50ML IV EMUL
INTRAVENOUS | Status: DC | PRN
  Administered 2024-01-17: 150 ug/kg/min via INTRAVENOUS

## 2024-01-17 SURGICAL SUPPLY — 27 items
ADHESIVE MASTISOL STRL (MISCELLANEOUS) IMPLANT
BAG DRAIN SIEMENS DORNER NS (MISCELLANEOUS) ×2 IMPLANT
BAG PRESSURE INF REUSE 3000 (BAG) ×2 IMPLANT
BRUSH SCRUB EZ 1% IODOPHOR (MISCELLANEOUS) ×2 IMPLANT
BRUSH SCRUB EZ 4% CHG (MISCELLANEOUS) IMPLANT
CATH URET FLEX-TIP 2 LUMEN 10F (CATHETERS) IMPLANT
CATH URETL OPEN 5X70 (CATHETERS) IMPLANT
CNTNR URN SCR LID CUP LEK RST (MISCELLANEOUS) IMPLANT
DRAPE UTILITY 15X26 TOWEL STRL (DRAPES) ×2 IMPLANT
DRSG TEGADERM 2-3/8X2-3/4 SM (GAUZE/BANDAGES/DRESSINGS) IMPLANT
FIBER LASER MOSES 200 DFL (Laser) IMPLANT
FIBER LASER MOSES 365 DFL (Laser) IMPLANT
GLOVE BIOGEL PI IND STRL 7.5 (GLOVE) ×2 IMPLANT
GOWN STRL REUS W/ TWL LRG LVL3 (GOWN DISPOSABLE) ×2 IMPLANT
GOWN STRL REUS W/ TWL XL LVL3 (GOWN DISPOSABLE) ×2 IMPLANT
GUIDEWIRE STR DUAL SENSOR (WIRE) ×2 IMPLANT
KIT TURNOVER CYSTO (KITS) ×2 IMPLANT
PACK CYSTO AR (MISCELLANEOUS) ×2 IMPLANT
SET CYSTO W/LG BORE CLAMP LF (SET/KITS/TRAYS/PACK) ×2 IMPLANT
SHEATH NAVIGATOR HD 12/14X36 (SHEATH) IMPLANT
SOL .9 NS 3000ML IRR UROMATIC (IV SOLUTION) ×2 IMPLANT
STENT URET 6FRX24 CONTOUR (STENTS) IMPLANT
STENT URET 6FRX26 CONTOUR (STENTS) IMPLANT
SURGILUBE 2OZ TUBE FLIPTOP (MISCELLANEOUS) ×2 IMPLANT
SYR 10ML LL (SYRINGE) ×2 IMPLANT
VALVE UROSEAL ADJ ENDO (VALVE) IMPLANT
WATER STERILE IRR 500ML POUR (IV SOLUTION) ×2 IMPLANT

## 2024-01-17 NOTE — Op Note (Signed)
 Date of procedure: 01/17/24  Preoperative diagnosis:  Left ureteral stone Left renal stones  Postoperative diagnosis:  Same  Procedure: Cystoscopy, left retrograde pyelogram with intraoperative interpretation, left ureteral stent placement Left ureteroscopy and laser lithotripsy of ureteral stone Left ureteroscopy and laser lithotripsy of renal stones  Surgeon: Legrand Rams, MD  Anesthesia: General  Complications: None  Intraoperative findings:  Normal bladder uncomplicated dusting of left mid ureteral stone and left renal stones, stent placed with Dangler  EBL: Minimal  Specimens: None  Drains: Left 6 French by 24 cm ureteral stent with Dangler  Indication: Deborah Mclean is a 53 y.o. patient with 4 mm left mid ureteral stone and renal stones previously stented urgently for UTI/sepsis from urinary source, here today for definitive management with ureteroscopy.  After reviewing the management options for treatment, they elected to proceed with the above surgical procedure(s). We have discussed the potential benefits and risks of the procedure, side effects of the proposed treatment, the likelihood of the patient achieving the goals of the procedure, and any potential problems that might occur during the procedure or recuperation. Informed consent has been obtained.  Description of procedure:  The patient was taken to the operating room and general anesthesia was induced. SCDs were placed for DVT prophylaxis. The patient was placed in the dorsal lithotomy position, prepped and draped in the usual sterile fashion, and preoperative antibiotics were administered. A preoperative time-out was performed.   A 21 French rigid cystoscope was used to intubate the urethra and thorough cystoscopy was performed.  The bladder was grossly normal.  The left ureteral stent was grasped and pulled to the meatus and a sensor wire was advanced through the stent up to the kidney under fluoroscopic  vision, and the old stent removed.  A semirigid short ureteroscope was advanced alongside the wire and a 4 mm stone was identified in the mid ureter.  A 365 m laser fiber on settings of 1.0 J and 10 Hz was used to methodically fragment the stone, and all pieces were irrigated free from the ureter.  The digital single-channel flexible ureteroscope was then advanced over the wire up to the kidney under fluoroscopic vision.  Thorough pyeloscopy revealed multiple small stones throughout the calyces, the largest measuring 3mm.  The 365 m laser fiber on settings of 0.5 J and 80 Hz was used to methodically fragment all stones.  Thorough pyeloscopy revealed no other stone fragments larger than the laser fiber.  A retrograde pyelogram was performed from the proximal ureter and showed no extravasation or filling defects.  Pullback ureteroscopy showed no other ureteral abnormalities or stones.  A wire was replaced through the scope and a 6 [place] by 24 cm stent was placed fluoroscopically with a curl in the kidney as well as within the bladder, the bladder was drained.  The Dangler was secured to the suprapubic region with Mastisol and Tegaderm.  Disposition: Stable to PACU  Plan: Can remove stent at home on Monday morning, follow-up with urology as needed  Legrand Rams, MD

## 2024-01-17 NOTE — Anesthesia Procedure Notes (Signed)
 Procedure Name: Intubation Date/Time: 01/17/2024 12:23 PM  Performed by: Cheral Bay, CRNAPre-anesthesia Checklist: Patient identified, Emergency Drugs available, Suction available and Patient being monitored Patient Re-evaluated:Patient Re-evaluated prior to induction Oxygen Delivery Method: Circle system utilized Preoxygenation: Pre-oxygenation with 100% oxygen Induction Type: IV induction Ventilation: Mask ventilation without difficulty Laryngoscope Size: McGrath and 3 Grade View: Grade I Tube type: Oral Tube size: 6.5 mm Number of attempts: 1 Airway Equipment and Method: Stylet Placement Confirmation: ETT inserted through vocal cords under direct vision, positive ETCO2 and breath sounds checked- equal and bilateral Secured at: 20 cm Tube secured with: Tape Dental Injury: Teeth and Oropharynx as per pre-operative assessment

## 2024-01-17 NOTE — H&P (Signed)
   01/17/24 11:44 AM   Deborah Mclean 1971/10/24 [id number]  CC: Left ureteral stone  HPI: 53 year old female who previously presented with a 4 mm mid left ureteral stone with concern for UTI and sepsis and underwent urgent stent placement.  Here today for definitive management with ureteroscopy.   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 Deborah Mclean: BREAST LUMPECTOMY;  Surgeon: Kieth Brightly, MD;  Location: [facility] ORS;  Service: General;  Laterality: Right;   CESAREAN SECTION  2009, 2012   CYSTOSCOPY WITH STENT PLACEMENT Left 12/31/2023   Procedure: CYSTOSCOPY WITH STENT PLACEMENT;  Surgeon: Sondra Come, MD;  Location: [facility] ORS;  Service: Urology;  Laterality: Left;      Family History: Family History  Problem Relation Age of Onset   Hyperlipidemia Mother    Hypertension Father    Hyperlipidemia Father    Breast cancer Neg Hx     Social History:  reports that she has never smoked. She has never used smokeless tobacco. She reports that she does not drink alcohol and does not use drugs.  Physical Exam: BP (!) 132/103   Pulse 70   Temp 98.2 F (36.8 C) (Oral)   Resp 18   Ht 5\' 6"  (1.676 m)   Wt 93.9 kg   LMP 12/24/2017 (Approximate)   SpO2 99%   BMI 33.41 kg/m    Constitutional:  Alert and oriented, No acute distress. Cardiovascular: Regular rate and rhythm Respiratory: Clear to auscultation bilaterally GI: Abdomen is soft, nontender, nondistended, no abdominal masses  Laboratory  Data: Urine culture mixed for   Assessment & Plan:   53 year old female previously stented for left ureteral stone with UTI/sepsis from urinary source.  We specifically discussed the risks ureteroscopy including bleeding, infection/sepsis, stent related symptoms including flank pain/urgency/frequency/incontinence/dysuria, ureteral injury, ureteral stricture, inability to access stone, or need for staged or additional procedures.  Left ureteroscopy, laser lithotripsy, stent placement today   Legrand Rams, MD 01/17/2024  [facility] [address], [address] [place], [place] [address] [phone]

## 2024-01-17 NOTE — Anesthesia Postprocedure Evaluation (Signed)
 Anesthesia Post Note  Patient: Deborah Mclean  Procedure(s) Performed: CYSTOSCOPY/URETEROSCOPY/HOLMIUM LASER/STENT EXCHANGE (Left: Ureter)  Patient location during evaluation: PACU Anesthesia Type: General Level of consciousness: awake and alert Pain management: pain level controlled Vital Signs Assessment: post-procedure vital signs reviewed and stable Respiratory status: spontaneous breathing, nonlabored ventilation, respiratory function stable and patient connected to nasal cannula oxygen Cardiovascular status: blood pressure returned to baseline and stable Postop Assessment: no apparent nausea or vomiting Anesthetic complications: no  No notable events documented.   Last Vitals:  Vitals:   01/17/24 1320 01/17/24 1325  BP:    Pulse: 69 69  Resp: 19 15  Temp:  (!) 36.3 C  SpO2: 99% 98%    Last Pain:  Vitals:   01/17/24 1325  TempSrc:   PainSc: 0-No pain                 Stephanie Coup

## 2024-01-17 NOTE — Transfer of Care (Signed)
 Immediate Anesthesia Transfer of Care Note  Patient: Deborah Mclean  Procedure(s) Performed: CYSTOSCOPY/URETEROSCOPY/HOLMIUM LASER/STENT EXCHANGE (Left: Ureter)  Patient Location: PACU  Anesthesia Type:General  Level of Consciousness: awake and drowsy  Airway & Oxygen Therapy: Patient Spontanous Breathing and Patient connected to face mask oxygen  Post-op Assessment: Report given to RN and Post -op Vital signs reviewed and stable  Post vital signs: Reviewed and stable  Last Vitals:  Vitals Value Taken Time  BP 132/79 01/17/24 1253  Temp 36.2 C 01/17/24 1253  Pulse 78 01/17/24 1256  Resp 18 01/17/24 1253  SpO2 97 % 01/17/24 1256  Vitals shown include unfiled device data.  Last Pain:  Vitals:   01/17/24 1253  TempSrc:   PainSc: Asleep         Complications: No notable events documented.

## 2024-01-17 NOTE — Anesthesia Preprocedure Evaluation (Signed)
 Anesthesia Evaluation  Patient identified by MRN, date of birth, ID band Patient awake  General Assessment Comment:  Infected kidney stone.  Reviewed: Allergy & Precautions, NPO status , Patient's Chart, lab work & pertinent test results  History of Anesthesia Complications (+) PONV and history of anesthetic complications  Airway Mallampati: II  TM Distance: >3 FB Neck ROM: Full    Dental no notable dental hx. (+) Teeth Intact   Pulmonary asthma , neg sleep apnea, neg COPD, Patient abstained from smoking.Not current smoker Hx of "bronchitis". Mild cough.   Pulmonary exam normal breath sounds clear to auscultation       Cardiovascular Exercise Tolerance: Good (-) hypertension(-) CAD and (-) Past MI negative cardio ROS (-) dysrhythmias  Rhythm:Regular Rate:Normal - Systolic murmurs    Neuro/Psych negative neurological ROS  negative psych ROS   GI/Hepatic ,GERD  ,,(+)     (-) substance abuse    Endo/Other  neg diabetes    Renal/GU      Musculoskeletal   Abdominal   Peds  Hematology   Anesthesia Other Findings Past Medical History: No date: GERD (gastroesophageal reflux disease) No date: History of kidney stones     Comment:  2006, 2012 No date: PONV (postoperative nausea and vomiting)  Reproductive/Obstetrics                             Anesthesia Physical Anesthesia Plan  ASA: 2  Anesthesia Plan: General   Post-op Pain Management: Minimal or no pain anticipated   Induction: Intravenous  PONV Risk Score and Plan: 3 and Propofol infusion, TIVA and Ondansetron  Airway Management Planned: Nasal Cannula  Additional Equipment: None  Intra-op Plan:   Post-operative Plan:   Informed Consent: I have reviewed the patients History and Physical, chart, labs and discussed the procedure including the risks, benefits and alternatives for the proposed anesthesia with the patient or  authorized representative who has indicated his/her understanding and acceptance.     Dental advisory given  Plan Discussed with: CRNA and Surgeon  Anesthesia Plan Comments: (Discussed risks of anesthesia with patient, including possibility of difficulty with spontaneous ventilation under anesthesia necessitating airway intervention, PONV, and rare risks such as cardiac or respiratory or neurological events, and allergic reactions. Discussed the role of CRNA in patient's perioperative care. Patient understands.)       Anesthesia Quick Evaluation

## 2024-01-18 ENCOUNTER — Encounter: Payer: Self-pay | Admitting: Urology
